# Patient Record
Sex: Female | Born: 1995
Health system: Southern US, Community
[De-identification: ages and names within clinical notes are randomized; demographics above are authoritative.]

## PROBLEM LIST (undated history)

## (undated) DIAGNOSIS — M25561 Pain in right knee: Secondary | ICD-10-CM

## (undated) DIAGNOSIS — G43909 Migraine, unspecified, not intractable, without status migrainosus: Secondary | ICD-10-CM

## (undated) DIAGNOSIS — R51 Headache: Secondary | ICD-10-CM

## (undated) DIAGNOSIS — D169 Benign neoplasm of bone and articular cartilage, unspecified: Secondary | ICD-10-CM

## (undated) DIAGNOSIS — R519 Headache, unspecified: Secondary | ICD-10-CM

## (undated) DIAGNOSIS — N920 Excessive and frequent menstruation with regular cycle: Secondary | ICD-10-CM

## (undated) HISTORY — DX: Pain in right knee: M25.561

## (undated) HISTORY — DX: Excessive and frequent menstruation with regular cycle: N92.0

## (undated) HISTORY — DX: Headache: R51

## (undated) HISTORY — DX: Benign neoplasm of bone and articular cartilage, unspecified: D16.9

## (undated) HISTORY — DX: Headache, unspecified: R51.9

---
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
## 2014-01-11 ENCOUNTER — Encounter: Payer: Self-pay | Admitting: Family

## 2014-01-11 ENCOUNTER — Ambulatory Visit (INDEPENDENT_AMBULATORY_CARE_PROVIDER_SITE_OTHER): Payer: BC Managed Care – PPO | Admitting: Family

## 2014-01-11 VITALS — BP 120/80 | HR 101 | Wt 183.0 lb

## 2014-01-11 DIAGNOSIS — N920 Excessive and frequent menstruation with regular cycle: Secondary | ICD-10-CM

## 2014-01-11 DIAGNOSIS — N921 Excessive and frequent menstruation with irregular cycle: Secondary | ICD-10-CM

## 2014-01-11 DIAGNOSIS — Z3009 Encounter for other general counseling and advice on contraception: Secondary | ICD-10-CM

## 2014-01-11 MED ORDER — LEVONORGEST-ETH ESTRAD 91-DAY 0.15-0.03 &0.01 MG PO TABS: 1.0000 | ORAL_TABLET | Freq: Every day | ORAL | Status: DC

## 2014-01-11 NOTE — Progress Notes (Signed)
   Subjective:    Patient ID: Alyssa Cunningham, female    DOB: 07-23-1996, 18 y.o.   MRN: [id number]  HPI 18 year old white female, nonsmoker, not sexually active female presents today with persistent complaints of abdominal cramping and pain during her menstrual periods. We started Levlen and her last office visit and she's had 2 cycles without much relief.   Review of Systems  Constitutional: Negative.   Respiratory: Negative.   Genitourinary: Positive for menstrual problem. Negative for frequency and hematuria.       Heavy menstrual cycles and cramping.   Musculoskeletal: Negative.   Neurological: Negative.   Psychiatric/Behavioral: Negative.    No past medical history on file.  History   Social History  . Marital Status: Single    Spouse Name: N/A    Number of Children: N/A  . Years of Education: N/A   Occupational History  . Not on file.   Social History Main Topics  . Smoking status: Never Smoker   . Smokeless tobacco: Not on file  . Alcohol Use: No  . Drug Use: No  . Sexual Activity: Not on file   Other Topics Concern  . Not on file   Social History Narrative  . No narrative on file    No past surgical history on file.  No family history on file.  No Known Allergies  No current outpatient prescriptions on file prior to visit.   No current facility-administered medications on file prior to visit.    BP 120/80  Pulse 101  Wt 183 lb (83.008 kg)chart    Objective:   Physical Exam  Constitutional: She is oriented to person, place, and time. She appears well-developed and well-nourished.  Neck: Normal range of motion. Neck supple.  Cardiovascular: Normal rate, regular rhythm and normal heart sounds.   Pulmonary/Chest: Effort normal and breath sounds normal.  Musculoskeletal: Normal range of motion.  Neurological: She is alert and oriented to person, place, and time.  Skin: Skin is warm and dry.  Psychiatric: She has a normal mood and affect.           Assessment & Plan:   Alyssa Cunningham was seen today for no specified reason.  Diagnoses and associated orders for this visit:  Birth control counseling  Menometrorrhagia  Other Orders - Levonorgestrel-Ethinyl Estradiol (SEASONIQUE) 0.15-0.03 &0.01 MG tablet; Take 1 tablet by mouth daily.   Call the office with any questions or concerns. Recheck in 3 months and sooner as needed

## 2014-01-11 NOTE — Patient Instructions (Signed)
Menorrhagia Menorrhagia is when your menstrual periods are heavy or last longer than usual.  HOME CARE  Only take medicine as told by your doctor.  Take any iron pills as told by your doctor. Heavy bleeding may cause low levels of iron in your body.  Do not take aspirin 1 week before or during your period. Aspirin can make the bleeding worse.  Lie down for a while if you change your tampon or pad more than once in 2 hours. This may help lessen the bleeding.  Eat a healthy diet and foods with iron. These foods include leafy green vegetables, meat, liver, eggs, and whole grain breads and cereals.  Do not try to lose weight. Wait until the heavy bleeding has stopped and your iron level is normal. GET HELP IF:  You soak through a pad or tampon every 1 or 2 hours, and this happens every time you have a period.  You need to use pads and tampons at the same time because you are bleeding so much.  You need to change your pad or tampon during the night.  You have a period that lasts for more than 8 days.  You pass clots bigger than 1 inch (2.5 cm) wide.  You have irregular periods that happen more or less often than once a month.  You feel dizzy or pass out (faint).  You feel very weak or tired.  You feel short of breath or feel your heart is beating too fast when you exercise.  You feel sick to your stomach (nausea) and you throw up (vomit) while you are taking your medicine.   You have watery poop (diarrhea) while you are taking your medicine.  You have any problems that may be related to the medicine you are taking.  GET HELP RIGHT AWAY IF:  You soak through 4 or more pads or tampons in 2 hours.  You have any bleeding while you are pregnant. MAKE SURE YOU:   Understand these instructions.  Will watch your condition.  Will get help right away if you are not doing well or get worse. Document Released: 06/22/2008 Document Revised: 05/16/2013 Document Reviewed:  03/15/2013 [facility] Patient Information 2014 [place], [place].

## 2014-04-11 ENCOUNTER — Telehealth: Payer: Self-pay | Admitting: Family

## 2014-04-11 NOTE — Telephone Encounter (Signed)
Pt mother is requesting new rx seasonique bcp sent to [facility]

## 2014-04-15 MED ORDER — LEVONORGEST-ETH ESTRAD 91-DAY 0.15-0.03 &0.01 MG PO TABS
1.0000 | ORAL_TABLET | Freq: Every day | ORAL | Status: DC
Start: 2014-04-15 — End: 2014-04-17

## 2014-04-15 NOTE — Telephone Encounter (Signed)
Pt's mom is following up on pt rx, states pt should have started on yesterday, needs rx today.

## 2014-04-15 NOTE — Telephone Encounter (Signed)
I was not sent original message.   Rx sent to pharmacy

## 2014-04-17 MED ORDER — LEVONORGEST-ETH ESTRAD 91-DAY 0.15-0.03 &0.01 MG PO TABS: 1.0000 | ORAL_TABLET | Freq: Every day | ORAL | Status: DC

## 2014-04-17 NOTE — Addendum Note (Signed)
Addended by: Santiago Bumpers on: 04/17/2014 04:33 PM   Modules accepted: Orders

## 2014-04-17 NOTE — Telephone Encounter (Signed)
Pt's mom states the rx was sent to wrong pharmacy, rx should have went to [facility] and the rx was sent to [facility]. Mom states she has to use [facility]

## 2014-04-17 NOTE — Telephone Encounter (Signed)
done

## 2014-12-05 ENCOUNTER — Ambulatory Visit (INDEPENDENT_AMBULATORY_CARE_PROVIDER_SITE_OTHER)
Admission: RE | Admit: 2014-12-05 | Discharge: 2014-12-05 | Disposition: A | Discharge status: Home / Self Care | Payer: 59 | Source: Ambulatory Visit | Attending: Family | Admitting: Family

## 2014-12-05 ENCOUNTER — Ambulatory Visit: Payer: Self-pay | Admitting: Family

## 2014-12-05 ENCOUNTER — Ambulatory Visit (INDEPENDENT_AMBULATORY_CARE_PROVIDER_SITE_OTHER): Payer: 59 | Admitting: Family

## 2014-12-05 ENCOUNTER — Encounter: Payer: Self-pay | Admitting: Family

## 2014-12-05 VITALS — BP 132/62 | HR 76 | Temp 99.2°F | Resp 18 | Wt 177.6 lb

## 2014-12-05 DIAGNOSIS — M25561 Pain in right knee: Secondary | ICD-10-CM

## 2014-12-05 NOTE — Progress Notes (Signed)
Pre visit review using our clinic review tool, if applicable. No additional management support is needed unless otherwise documented below in the visit note. 

## 2014-12-05 NOTE — Patient Instructions (Signed)

## 2014-12-05 NOTE — Progress Notes (Signed)
   Subjective:    Patient ID: Alyssa Cunningham, female    DOB: Jan 03, 1996, 19 y.o.   MRN: [id number]  HPI 19 year old white female, nonsmoker is in today with complaints of pain to the right knee. Reports injury approximately one year ago that resolved after a fall. However, over the last 2 months she's had pain that she describes as achy in her right knee. Worse with going up heels. Pain is a 2 out of 10 after taking Aleve. Normally 6 out of 10.   Review of Systems  Constitutional: Negative.   Respiratory: Negative.   Cardiovascular: Negative.   Gastrointestinal: Negative.   Endocrine: Negative.   Genitourinary: Negative.   Musculoskeletal: Positive for arthralgias.       Knee pain   Neurological: Negative.   Hematological: Negative.   Psychiatric/Behavioral: Negative.        Objective:   Physical Exam  Constitutional: She appears well-developed and well-nourished.  Neck: Normal range of motion. Neck supple.  Cardiovascular: Normal rate, regular rhythm and normal heart sounds.   Pulmonary/Chest: Effort normal and breath sounds normal.  Musculoskeletal: She exhibits tenderness. She exhibits no edema.  Neurological: She is alert.  Skin: Skin is warm and dry.  Psychiatric: She has a normal mood and affect.          Assessment & Plan:  Alyssa Cunningham was seen today for knee pain.  Diagnoses and all orders for this visit:  Right anterior knee pain Orders: -     DG Knee Complete 4 Views Right; Future   Continue Aleve. Due to previous injury, obtain x-ray of the right knee. Call the office with any questions or concerns. Recheck as scheduled and sooner as needed.

## 2014-12-06 ENCOUNTER — Other Ambulatory Visit: Payer: Self-pay | Admitting: Family

## 2014-12-06 MED ORDER — MELOXICAM 15 MG PO TABS: 15.0000 mg | ORAL_TABLET | Freq: Every day | ORAL | Status: DC

## 2014-12-16 ENCOUNTER — Encounter: Payer: Self-pay | Admitting: Family

## 2014-12-24 ENCOUNTER — Ambulatory Visit (INDEPENDENT_AMBULATORY_CARE_PROVIDER_SITE_OTHER): Payer: 59 | Admitting: Family

## 2014-12-24 ENCOUNTER — Encounter: Payer: Self-pay | Admitting: Family

## 2014-12-24 VITALS — BP 128/70 | HR 96 | Temp 98.7°F | Wt 178.0 lb

## 2014-12-24 DIAGNOSIS — M25561 Pain in right knee: Secondary | ICD-10-CM

## 2014-12-24 DIAGNOSIS — Z309 Encounter for contraceptive management, unspecified: Secondary | ICD-10-CM

## 2014-12-24 DIAGNOSIS — Z3009 Encounter for other general counseling and advice on contraception: Secondary | ICD-10-CM

## 2014-12-24 MED ORDER — NORGESTIM-ETH ESTRAD TRIPHASIC 0.18/0.215/0.25 MG-25 MCG PO TABS: 1.0000 | ORAL_TABLET | Freq: Every day | ORAL | Status: DC

## 2014-12-24 NOTE — Patient Instructions (Signed)
Meniscus Tear with Phase I Rehab The meniscus is a C-shaped cartilage structure, located in the knee joint between the thigh bone (femur) and the shinbone (tibia). Two menisci are located in each knee joint: the inner and outer meniscus. The meniscus acts as an adapter between the thigh bone and shinbone, allowing them to fit properly together. It also functions as a shock absorber, to reduce the stress placed on the knee joint and to help supply nutrients to the knee joint cartilage. As people age, the meniscus begins to harden and become more vulnerable to injury. Meniscus tears are a common injury, especially in older athletes. Inner meniscus tears are more common than outer meniscus tears.  SYMPTOMS   Pain in the knee, especially with standing or squatting with the affected leg.  Tenderness along the joint line.  Swelling in the knee joint (effusion), usually starting 1 to 2 days after injury.  Locking or catching of the knee joint, causing inability to straighten the knee completely.  Giving way or buckling of the knee. CAUSES  A meniscus tear occurs when a force is placed on the meniscus that is greater than it can handle. Common causes of injury include:  Direct hit (trauma) to the knee.  Twisting, pivoting, or cutting (rapidly changing direction while running), kneeling or squatting.  Without injury, due to aging. RISK INCREASES WITH:  Contact sports (football, rugby).  Sports in which cleats are used with pivoting (soccer, lacrosse) or sports in which good shoe grip and sudden change in direction are required (racquetball, basketball, squash).  Previous knee injury.  Associated knee injury, particularly ligament injuries.  Poor strength and flexibility. PREVENTION  Warm up and stretch properly before activity.  Maintain physical fitness:  Strength, flexibility, and endurance.  Cardiovascular fitness.  Protect the knee with a brace or elastic bandage.  Wear  properly fitted protective equipment (proper cleats for the surface). PROGNOSIS  Sometimes, meniscus tears heal on their own. However, definitive treatment requires surgery, followed by at least 6 weeks of recovery.  RELATED COMPLICATIONS   Recurring symptoms that result in a chronic problem.  Repeated knee injury, especially if sports are resumed too soon after injury or surgery.  Progression of the tear (the tear gets larger), if untreated.  Arthritis of the knee in later years (with or without surgery).  Complications of surgery, including infection, bleeding, injury to nerves (numbness, weakness, paralysis) continued pain, giving way, locking, nonhealing of meniscus (if repaired), need for further surgery, and knee stiffness (loss of motion). TREATMENT  Treatment first involves the use of ice and medicine, to reduce pain and inflammation. You may find using crutches to walk more comfortable. However, it is okay to bear weight on the injured knee, if the pain will allow it. Surgery is often advised as a definitive treatment. Surgery is performed through an incision near the joint (arthroscopically). The torn piece of the meniscus is removed, and if possible the joint cartilage is repaired. After surgery, the joint must be restrained. After restraint, it is important to perform strengthening and stretching exercises to help regain strength and a full range of motion. These exercises may be completed at home or with a therapist.  MEDICATION  If pain medicine is needed, nonsteroidal anti-inflammatory medicines (aspirin and ibuprofen), or other minor pain relievers (acetaminophen), are often advised.  Do not take pain medicine for 7 days before surgery.  Prescription pain relievers may be given, if your caregiver thinks they are needed. Use only as directed and   only as much as you need. HEAT AND COLD  Cold treatment (icing) should be applied for 10 to 15 minutes every 2 to 3 hours for  inflammation and pain, and immediately after activity that aggravates your symptoms. Use ice packs or an ice massage.  Heat treatment may be used before performing stretching and strengthening activities prescribed by your caregiver, physical therapist, or athletic trainer. Use a heat pack or a warm water soak. SEEK MEDICAL CARE IF:   Symptoms get worse or do not improve in 2 weeks, despite treatment.  New, unexplained symptoms develop. (Drugs used in treatment may produce side effects.) EXERCISES RANGE OF MOTION (ROM) AND STRETCHING EXERCISES - Meniscus Tear, Non-operative, Phase I These are some of the initial exercises with which you may start your rehabilitation program, until you see your caregiver again or until your symptoms are resolved. Remember:   These initial exercises are intended to be gentle. They will help you restore motion without increasing any swelling.  Completing these exercises allows less painful movement and prepares you for the more aggressive strengthening exercises in Phase II.  An effective stretch should be held for at least 30 seconds.  A stretch should never be painful. You should only feel a gentle lengthening or release in the stretched tissue. RANGE OF MOTION - Knee Flexion, Active  Lie on your back with both knees straight. (If this causes back discomfort, bend your healthy knee, placing your foot flat on the floor.)  Slowly slide your heel back toward your buttocks until you feel a gentle stretch in the front of your knee or thigh.  Hold for __________ seconds. Slowly slide your heel back to the starting position. Repeat __________ times. Complete this exercise __________ times per day.  RANGE OF MOTION - Knee Flexion and Extension, Active-Assisted  Sit on the edge of a table or chair with your thighs firmly supported. It may be helpful to place a folded towel under the end of your right / left thigh.  Flexion (bending): Place the ankle of your  healthy leg on top of the other ankle. Use your healthy leg to gently bend your right / left knee until you feel a mild tension across the top of your knee.  Hold for __________ seconds.  Extension (straightening): Switch your ankles so your right / left leg is on top. Use your healthy leg to straighten your right / left knee until you feel a mild tension on the backside of your knee.  Hold for __________ seconds. Repeat __________ times. Complete __________ times per day. STRETCH - Knee Flexion, Supine  Lie on the floor with your right / left heel and foot lightly touching the wall. (Place both feet on the wall if you do not use a door frame.)  Without using any effort, allow gravity to slide your foot down the wall slowly until you feel a gentle stretch in the front of your right / left knee.  Hold this stretch for __________ seconds. Then return the leg to the starting position, using your healthy leg for help, if needed. Repeat __________ times. Complete this stretch __________ times per day.  STRETCH - Knee Extension Sitting  Sit with your right / left leg/heel propped on another chair, coffee table, or foot stool.  Allow your leg muscles to relax, letting gravity straighten out your knee.*  You should feel a stretch behind your right / left knee. Hold this position for __________ seconds. Repeat __________ times. Complete this stretch __________   times per day.  *Your physician, physical therapist or athletic trainer may instruct you place a __________ weight on your thigh, just above your kneecap, to deepen the stretch.  STRENGTHENING EXERCISES - Meniscus Tear, Non-operative, Phase I These exercises may help you when beginning to rehabilitate your injury. They may resolve your symptoms with or without further involvement from your physician, physical therapist or athletic trainer. While completing these exercises, remember:   Muscles can gain both the endurance and the strength  needed for everyday activities through controlled exercises.  Complete these exercises as instructed by your physician, physical therapist or athletic trainer. Progress the resistance and repetitions only as guided. STRENGTH - Quadriceps, Isometrics  Lie on your back with your right / left leg extended and your opposite knee bent.  Gradually tense the muscles in the front of your right / left thigh. You should see either your knee cap slide up toward your hip or increased dimpling just above the knee. This motion will push the back of the knee down toward the floor, mat, or bed on which you are lying.  Hold the muscle as tight as you can, without increasing your pain, for __________ seconds.  Relax the muscles slowly and completely between each repetition. Repeat __________ times. Complete this exercise __________ times per day.  STRENGTH - Quadriceps, Short Arcs   Lie on your back. Place a __________ inch towel roll under your right / left knee, so that the knee bends slightly.  Raise only your lower leg by tightening the muscles in the front of your thigh. Do not allow your thigh to rise.  Hold this position for __________ seconds. Repeat __________ times. Complete this exercise __________ times per day.  OPTIONAL ANKLE WEIGHTS: Begin with ____________________, but DO NOT exceed ____________________. Increase in 1 pound/0.5 kilogram increments. STRENGTH - Quadriceps, Straight Leg Raises  Quality counts! Watch for signs that the quadriceps muscle is working, to be sure you are strengthening the correct muscles and not "cheating" by substituting with healthier muscles.  Lay on your back with your right / left leg extended and your opposite knee bent.  Tense the muscles in the front of your right / left thigh. You should see either your knee cap slide up or increased dimpling just above the knee. Your thigh may even shake a bit.  Tighten these muscles even more and raise your leg 4 to 6  inches off the floor. Hold for __________ seconds.  Keeping these muscles tense, lower your leg.  Relax the muscles slowly and completely in between each repetition. Repeat __________ times. Complete this exercise __________ times per day.  STRENGTH - Hamstring, Curls   Lay on your stomach with your legs extended. (If you lay on a bed, your feet may hang over the edge.)  Tighten the muscles in the back of your thigh to bend your right / left knee up to 90 degrees. Keep your hips flat on the bed.  Hold this position for __________ seconds.  Slowly lower your leg back to the starting position. Repeat __________ times. Complete this exercise __________ times per day.  STRENGTH - Quadriceps, Squats  Stand in a door frame so that your feet and knees are in line with the frame.  Use your hands for balance, not support, on the frame.  Slowly lower your weight, bending at the hips and knees. Keep your lower legs upright so that they are parallel with the door frame. Squat only within the range that does  not increase your knee pain. Never let your hips drop below your knees.  Slowly return upright, pushing with your legs, not pulling with your hands. Repeat __________ times. Complete this exercise __________ times per day.  STRENGTH - Quad/VMO, Isometric   Sit in a chair with your right / left knee slightly bent. With your fingertips, feel the VMO muscle just above the inside of your knee. The VMO is important in controlling the position of your kneecap.  Keeping your fingertips on this muscle. Without actually moving your leg, attempt to drive your knee down as if straightening your leg. You should feel your VMO tense. If you have a difficult time, you may wish to try the same exercise on your healthy knee first.  Tense this muscle as hard as you can without increasing any knee pain.  Hold for __________ seconds. Relax the muscles slowly and completely in between each repetition. Repeat  __________ times. Complete exercise __________ times per day.  Document Released: 09/27/1998 Document Revised: 12/06/2011 Document Reviewed: 12/26/2008 [facility] Patient Information 2015 [place], [place]. This information is not intended to replace advice given to you by your health care provider. Make sure you discuss any questions you have with your health care provider.

## 2014-12-24 NOTE — Progress Notes (Signed)
   Subjective:    Patient ID: Alyssa Cunningham, female    DOB: 02/06/1996, 19 y.o.   MRN: [id number]  HPI 19 year old white female, nonsmoker is in today for right knee pain that's persist over the last year after a fall. Reports the symptoms being bad for about 6 months resolving for a couple months them returning. The pain daily is a 4 out of 10 while taking meloxicam. When she does not take the medication at 8 out of 10. Worse with walking. Denies any new injury. Had an x-ray done that was normal. Continues to have problems.  Also has concerns that her menstrual cycles are extremely heavy after Seasonique. Will like to have a monthly menstrual cycle and change birth control.  Review of Systems  Constitutional: Negative.   Respiratory: Negative.   Cardiovascular: Negative.   Gastrointestinal: Negative.   Endocrine: Negative.   Musculoskeletal: Positive for arthralgias.       Right knee pain  Skin: Negative.   Allergic/Immunologic: Negative.   Psychiatric/Behavioral: Negative.    History reviewed. No pertinent past medical history.  History   Social History  . Marital Status: Single    Spouse Name: N/A  . Number of Children: N/A  . Years of Education: N/A   Occupational History  . Not on file.   Social History Main Topics  . Smoking status: Never Smoker   . Smokeless tobacco: Not on file  . Alcohol Use: No  . Drug Use: No  . Sexual Activity: Not on file   Other Topics Concern  . Not on file   Social History Narrative    History reviewed. No pertinent past surgical history.  History reviewed. No pertinent family history.  No Known Allergies  Current Outpatient Prescriptions on File Prior to Visit  Medication Sig Dispense Refill  . meloxicam (MOBIC) 15 MG tablet Take 1 tablet (15 mg total) by mouth daily. 30 tablet 0   No current facility-administered medications on file prior to visit.    BP 128/70 mmHg  Pulse 96  Temp(Src) 98.7 F (37.1 C) (Oral)  Wt 178  lb (80.74 kg)  LMP 02/22/2016chart    Objective:   Physical Exam  Constitutional: She is oriented to person, place, and time. She appears well-developed and well-nourished.  Neck: Normal range of motion. Neck supple.  Cardiovascular: Normal rate, regular rhythm and normal heart sounds.   Pulmonary/Chest: Effort normal and breath sounds normal.  Abdominal: Soft. Bowel sounds are normal.  Musculoskeletal: She exhibits edema and tenderness.  Neurological: She is alert and oriented to person, place, and time.  Skin: Skin is warm and dry.  Psychiatric: She has a normal mood and affect.          Assessment & Plan:  Manreet was seen today for knee pain.  Diagnoses and all orders for this visit:  Right knee pain Orders: -     MR Knee Right Wo Contrast; Future  Birth control counseling  Other orders -     Norgestimate-Ethinyl Estradiol Triphasic (ORTHO TRI-CYCLEN LO) 0.18/0.215/0.25 MG-25 MCG tab; Take 1 tablet by mouth daily.   MRI of the right knee will notify patient pending results. Refer to orthopedics. Meloxicam as needed for pain.

## 2014-12-24 NOTE — Progress Notes (Signed)
Pre visit review using our clinic review tool, if applicable. No additional management support is needed unless otherwise documented below in the visit note. 

## 2014-12-27 ENCOUNTER — Other Ambulatory Visit: Payer: 59

## 2015-01-03 ENCOUNTER — Ambulatory Visit
Admission: RE | Admit: 2015-01-03 | Discharge: 2015-01-03 | Disposition: A | Discharge status: Home / Self Care | Payer: 59 | Source: Ambulatory Visit | Attending: Family | Admitting: Family

## 2015-01-03 DIAGNOSIS — M25561 Pain in right knee: Secondary | ICD-10-CM

## 2015-01-05 ENCOUNTER — Other Ambulatory Visit: Payer: Self-pay | Admitting: Family

## 2015-01-05 DIAGNOSIS — D169 Benign neoplasm of bone and articular cartilage, unspecified: Secondary | ICD-10-CM

## 2015-01-06 ENCOUNTER — Encounter: Payer: Self-pay | Admitting: Family

## 2015-01-08 ENCOUNTER — Encounter: Payer: Self-pay | Admitting: Family

## 2015-01-08 ENCOUNTER — Other Ambulatory Visit: Payer: Self-pay

## 2015-01-08 NOTE — Telephone Encounter (Signed)
Error

## 2015-02-20 ENCOUNTER — Encounter: Payer: Self-pay | Admitting: Family Medicine

## 2015-02-20 ENCOUNTER — Ambulatory Visit (INDEPENDENT_AMBULATORY_CARE_PROVIDER_SITE_OTHER): Payer: 59 | Admitting: Family Medicine

## 2015-02-20 VITALS — BP 110/76 | Temp 98.5°F | Ht 68.0 in | Wt 173.2 lb

## 2015-02-20 DIAGNOSIS — R51 Headache: Secondary | ICD-10-CM

## 2015-02-20 DIAGNOSIS — N938 Other specified abnormal uterine and vaginal bleeding: Secondary | ICD-10-CM | POA: Diagnosis not present

## 2015-02-20 DIAGNOSIS — R519 Headache, unspecified: Secondary | ICD-10-CM | POA: Insufficient documentation

## 2015-02-20 DIAGNOSIS — Z7189 Other specified counseling: Secondary | ICD-10-CM

## 2015-02-20 DIAGNOSIS — N946 Dysmenorrhea, unspecified: Secondary | ICD-10-CM

## 2015-02-20 DIAGNOSIS — G8929 Other chronic pain: Secondary | ICD-10-CM

## 2015-02-20 DIAGNOSIS — M25561 Pain in right knee: Secondary | ICD-10-CM | POA: Diagnosis not present

## 2015-02-20 DIAGNOSIS — Z7689 Persons encountering health services in other specified circumstances: Secondary | ICD-10-CM

## 2015-02-20 NOTE — Progress Notes (Signed)
Pre visit review using our clinic review tool, if applicable. No additional management support is needed unless otherwise documented below in the visit note.  HPI:  Alyssa Cunningham is here to establish care. Used to see Padonda whom left this clinic.  Has the following chronic problems that require follow up and concerns today:  Headaches: -since age 19 -occ migraines, frequent mild headaches -sometimes related to periods, recent change in [facility] (used to be on seasonique; recently switched to norgestimate ethinyl estradiol) ,  and had headache with her last period, also seems to happen more when wearing her, no aura contacts - never gets HA when wearing her glasses -most HAs mild and respond to ibuprofen - uses a few times per month -denies: change in HAs, worsening, fevers, malaise, vomiting, vision changes -cbc and TSH normal  Dysmenorrhea/Heavy menstrual bleeding/irr menstrual bleeding: -for several years -mood swings, cramping, changes pad every 2-3 hours for first few days of periods -denies: syncope, dizziness, palpitation, bleeding between periods, hirsutism, breast discharge -denies any hx of sexual activity   R knee/tibial osteochondroma: -since Dec 2016 -seeing ortho, Dr. French Ana at [facility] ortho -takes diclofenac -getting PT -denies: weakness, numbness, worsening  ROS negative for unless reported above: fevers, unintentional weight loss, hearing or vision loss, chest pain, palpitations, struggling to breath, hemoptysis, melena, hematochezia, hematuria, falls, loc, si, thoughts of self harm  Past Medical History  Diagnosis Date  . Frequent headaches     since age 29, migraines rarely  . Menorrhagia     DUB, dysmenorrhea since 2014  . Right knee pain   . Osteochondroma     sees [place] ortho    History reviewed. No pertinent past surgical history.  Family History  Problem Relation Age of Onset  . Adopted: Yes  . Family history unknown: Yes    History   Social History   . Marital Status: Single    Spouse Name: N/A  . Number of Children: N/A  . Years of Education: N/A   Social History Main Topics  . Smoking status: Never Smoker   . Smokeless tobacco: Not on file  . Alcohol Use: No  . Drug Use: No  . Sexual Activity: No   Other Topics Concern  . None   Social History Narrative   Work or School: working at [employer]: lives with mother and brother      Spiritual Beliefs: Christian      Lifestyle: no regular exercise; diet is healthy           Current outpatient prescriptions:  .  diclofenac (VOLTAREN) 75 MG EC tablet, Take 75 mg by mouth 2 (two) times daily., Disp: , Rfl:  .  Norgestimate-Ethinyl Estradiol Triphasic (ORTHO TRI-CYCLEN LO) 0.18/0.215/0.25 MG-25 MCG tab, Take 1 tablet by mouth daily., Disp: 1 Package, Rfl: 11  EXAM:  Filed Vitals:   02/20/15 0945  BP: 110/76  Temp: 98.5 F (36.9 C)    Body mass index is 26.34 kg/(m^2).  GENERAL: vitals reviewed and listed above, alert, oriented, appears well hydrated and in no acute distress  HEENT: atraumatic, conjunttiva clear, no obvious abnormalities on inspection of external nose and ears  NECK: no obvious masses on inspection  LUNGS: clear to auscultation bilaterally, no wheezes, rales or rhonchi, good air movement  CV: HRRR, no peripheral edema  MS: moves all extremities without noticeable abnormality  PSYCH: pleasant and cooperative, no obvious depression or anxiety  ASSESSMENT AND PLAN:  Discussed  the following assessment and plan:  Dysmenorrhea -continue new OCP, she reports thinks is improving -gyn referral if not improving  Frequent headaches -ha journal, mild now and tx with infrequent OTC anagesic  Knee pain, chronic, right -cont with ortho  DUB (dysfunctional uterine bleeding) -see above  Encounter to establish care -We reviewed the PMH, PSH, FH, SH, Meds and Allergies. -We provided refills for any medications we will  prescribe as needed. -We addressed current concerns per orders and patient instructions. -We have asked for records for pertinent exams, studies, vaccines and notes from previous providers. -We have advised patient to follow up per instructions below.   -Patient advised to return or notify a doctor immediately if symptoms worsen or persist or new concerns arise.  There are no Patient Instructions on file for this visit.   Colin Benton R.

## 2015-02-20 NOTE — Patient Instructions (Addendum)
BEFORE YOU LEAVE: -schedule follow up in 3-4 months -see if she wants the Tdap -ensure signed DPR allowing [place] to discuss your health with your mother if you wish  Please see you eye doctor for exam  Keep journal of headaches and triggers

## 2015-03-13 ENCOUNTER — Telehealth: Payer: Self-pay | Admitting: Family Medicine

## 2015-03-13 NOTE — Telephone Encounter (Signed)
Pt called wanting to speak with nurse for [facility] pills rx. Would like to be contacted on the matter. Pharmacy informed her of something about 90day rx and she is unclear.

## 2015-03-14 MED ORDER — NORGESTIM-ETH ESTRAD TRIPHASIC 0.18/0.215/0.25 MG-25 MCG PO TABS: 1.0000 | ORAL_TABLET | Freq: Every day | ORAL | Status: DC

## 2015-03-14 NOTE — Telephone Encounter (Signed)
I spoke with the pts mother and advised her Dr Maudie Mercury approved a 29 day supply and this was sent to her pharmacy.

## 2015-11-04 ENCOUNTER — Other Ambulatory Visit: Payer: Self-pay | Admitting: Family

## 2015-11-17 ENCOUNTER — Ambulatory Visit (INDEPENDENT_AMBULATORY_CARE_PROVIDER_SITE_OTHER): Payer: 59 | Admitting: Sports Medicine

## 2015-11-17 ENCOUNTER — Encounter: Payer: Self-pay | Admitting: Sports Medicine

## 2015-11-17 VITALS — BP 124/73 | HR 77 | Ht 68.5 in | Wt 166.0 lb

## 2015-11-17 DIAGNOSIS — M25561 Pain in right knee: Secondary | ICD-10-CM

## 2015-11-17 MED ORDER — DICLOFENAC SODIUM 75 MG PO TBEC: DELAYED_RELEASE_TABLET | ORAL | Status: DC

## 2015-11-17 NOTE — Progress Notes (Signed)
  Alyssa Cunningham - 20 y.o. female MRN [id number]  Date of birth: 1995/12/02  SUBJECTIVE:  Including CC & ROS.  No chief complaint on file. CC: R knee pain  HPI: Patient presents today with >1 year of R knee pain. First noticed the pain in Dec 2015 while she was seated with legs crossed, then pain became more persistent. Pain described as an ache in her knee that is occasionally sharp and burning. At her baseline, the pain is a 2-3/10 but can escalate to a 7-8/10. When the pain is severe, she takes Advil, uses a heating pad, and elevates her leg. Was prescribed Mobic by her PCP, but this did not improve her pain. She has been unable to play tennis or run longer distances since the pain began. Is a Ship broker at [employer] and has noticed her pain worsens when she walks up hills and steps more frequently. Squatting and lunges also make her pain worse. No prior history of knee trauma or surgery.   HISTORY: Past Medical, Surgical, Social, and Family History Reviewed & Updated per EMR.   Pertinent Historical Findings include: No significant PMH Is a sophomore at [employer]: 4-view X-rays of R knee from 12/05/14 MRI R knee from 01/03/2015  PHYSICAL EXAM:  VS: BP:124/73 mmHg  HR:77bpm  TEMP: ( )  RESP:   HT:5' 8.5" (174 cm)   WT:166 lb (75.297 kg)  BMI:24.9 PHYSICAL EXAM: Gen: Well-appearing Caucasian female, NAD HEENT: normocephalic, atraumatic, EOMI Pulm: non-labored respirations  R knee: No joint effusion or erythema Non-tender to palpation over the quad tendon, patella, patellar tendon, medial or lateral joint lines Full ROM with flexion and extension 5/5 strength with knee flexion and extension.  No increased varus and valgus laxity.  Tight lateral retinaculum Neurovascularly intact  ASSESSMENT & PLAN: See problem based charting & AVS for pt instructions. 1.) Chondromalacia patella - Patient's history of persistent knee pain worsened with knee flexion and physical exam  findings are consistent with chondromalacia patella. MRI and x-ray reassuring. Patient has not completed PT in the past and she would greatly benefit from quad strengthening exercises. - PT script written, patient will find PT available at [facility] - Diclofenac for when pain is severe, d/c other NSAIDs while using - Avoid squatting, lunges, and breast stroke while recovering - Will follow up in approx 8 weeks as class schedule allows

## 2016-01-19 ENCOUNTER — Encounter: Payer: Self-pay | Admitting: Sports Medicine

## 2016-01-19 ENCOUNTER — Ambulatory Visit (INDEPENDENT_AMBULATORY_CARE_PROVIDER_SITE_OTHER): Payer: 59 | Admitting: Sports Medicine

## 2016-01-19 VITALS — BP 126/56 | Ht 68.5 in | Wt 166.0 lb

## 2016-01-19 DIAGNOSIS — M25561 Pain in right knee: Secondary | ICD-10-CM | POA: Diagnosis not present

## 2016-01-19 NOTE — Progress Notes (Signed)
   Subjective:    Patient ID: Alyssa Cunningham, female    DOB: [birth year], 20 y.o.   MRN: [id number]  HPI   Patient comes in today for follow-up on right knee pain. Pain has improved with physical therapy. 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 She is going 2 to 3 times a week. Still using her knee sleeve with activity. She is no longer needing diclofenac. She is here today with her mother.    Review of Systems     Objective:   Physical Exam  Well-developed, well-nourished. No acute distress  Right knee: Full range of motion. No effusion. Negative patellar grind. No joint line tenderness. Good ligamentous stability. There is still some slight quad weakness. Neurovascularly intact distally.       Assessment & Plan:   Improving right knee pain secondary to patellofemoral pain syndrome/chondromalacia patella  Patient is making good progress. Encouraged her to continue with physical therapy until the therapist feels like she is ready for a home exercise program. She has 3 weeks left of school and she will then be returning to [place]. She will let me know if she needs more formal PT upon her arrival back to [place]. Otherwise, she understands the importance of continuing with her exercises. I think she can start to increase activity as tolerated. She can also begin to decrease usage of the compression sleeve if she would like. She will follow-up with me as needed.

## 2016-01-27 ENCOUNTER — Other Ambulatory Visit: Payer: Self-pay | Admitting: Family Medicine

## 2016-02-09 ENCOUNTER — Ambulatory Visit (INDEPENDENT_AMBULATORY_CARE_PROVIDER_SITE_OTHER): Payer: 59 | Admitting: Sports Medicine

## 2016-02-09 ENCOUNTER — Encounter: Payer: Self-pay | Admitting: Sports Medicine

## 2016-02-09 ENCOUNTER — Ambulatory Visit
Admission: RE | Admit: 2016-02-09 | Discharge: 2016-02-09 | Disposition: A | Discharge status: Home / Self Care | Payer: 59 | Source: Ambulatory Visit | Attending: Sports Medicine | Admitting: Sports Medicine

## 2016-02-09 VITALS — BP 111/69 | Ht 68.5 in | Wt 165.0 lb

## 2016-02-09 DIAGNOSIS — M79645 Pain in left finger(s): Secondary | ICD-10-CM

## 2016-02-09 NOTE — Progress Notes (Signed)
   Subjective:    Patient ID: Alyssa Cunningham, female    DOB: 1996-02-19, 20 y.o.   MRN: [id number]  HPI  CC: left thumb pain  Alyssa Cunningham is a 20-y.o. female who presents with left thumb pain after shutting her left thumb in a door 2 weeks ago.  She was seen at an urgent care facility near her school after the incident, where x-rays revealed a possible fracture of the left thumb.  She was placed in a thumb splint and advised to follow up here.  Currently, she complains of pain mainly over the base of her left thumbnail.  She has been wearing the thumb splint for all activities and taking it off only to shower.  Taking Ibuprofen or Tylenol occasionally for pain.  Past medical and surgical history unremarkable. Medications reviewed. Allergies reviewed.  Review of Systems As noted in HPI.    Objective:   Physical Exam  Well-developed, well-nourished.  Alert and oriented x3.  Vital signs reviewed.  Left thumb: No apparent deformity, swelling, or bruising on inspection.  Tenderness to palpation over based of nailbed.  Range of motion limited by pain and stiffness.  Left upper extremity otherwise neurovascularly intact.  X-ray of left thumb obtained on 01/24/16 at urgent care facility.  Images not available.  Report reviewed and revealed a questionable nondisplaced fracture of the proximal volar corner base of the distal phalanx of the thumb.      Assessment & Plan:   Left thumb pain with questionable nondisplaced fracture of the distal phalanx  Given that images of previously obtained x-ray are unavailable to [place], will obtain new images today.  This will help [place] discern whether or not patient truly does have a distal phalanx fracture.  If no fracture is seen, plan to allow patient to remove splint and begin on working on range of motion.  If fracture is present, patient will likely need volar splint placed with thumb in slight flexion.  We will contact the patient with the x-ray results once we  have received them.  Will need to schedule follow-up in office depending on what x-ray shows.  Jenna E. Yolanda Bonine, M.D.  Patient seen and evaluated with the resident. I agree with the above plan of care. Today's x-rays show no evidence of fracture. Patient is reassured of these findings. She may discontinue her stack splint and resume activity as tolerated. Follow-up for ongoing or recalcitrant issues.

## 2016-05-22 ENCOUNTER — Other Ambulatory Visit: Payer: Self-pay | Admitting: Family Medicine

## 2016-07-06 ENCOUNTER — Other Ambulatory Visit: Payer: Self-pay | Admitting: Family Medicine

## 2016-07-15 MED ORDER — NORGESTIM-ETH ESTRAD TRIPHASIC 0.18/0.215/0.25 MG-25 MCG PO TABS: 1.0000 | ORAL_TABLET | Freq: Every day | ORAL | 0 refills | Status: DC

## 2017-03-11 ENCOUNTER — Ambulatory Visit (INDEPENDENT_AMBULATORY_CARE_PROVIDER_SITE_OTHER): Payer: 59 | Admitting: Family Medicine

## 2017-03-11 ENCOUNTER — Encounter: Payer: Self-pay | Admitting: Family Medicine

## 2017-03-11 VITALS — BP 122/82 | HR 67 | Resp 12 | Ht 68.5 in | Wt 183.6 lb

## 2017-03-11 DIAGNOSIS — N938 Other specified abnormal uterine and vaginal bleeding: Secondary | ICD-10-CM

## 2017-03-11 DIAGNOSIS — M545 Low back pain, unspecified: Secondary | ICD-10-CM

## 2017-03-11 DIAGNOSIS — F419 Anxiety disorder, unspecified: Secondary | ICD-10-CM

## 2017-03-11 DIAGNOSIS — N946 Dysmenorrhea, unspecified: Secondary | ICD-10-CM | POA: Diagnosis not present

## 2017-03-11 MED ORDER — METHOCARBAMOL 500 MG PO TABS: 500.0000 mg | ORAL_TABLET | Freq: Three times a day (TID) | ORAL | 0 refills | Status: AC | PRN

## 2017-03-11 MED ORDER — NORETHINDRONE ACET-ETHINYL EST 1.5-30 MG-MCG PO TABS: 1.0000 | ORAL_TABLET | Freq: Every day | ORAL | 2 refills | Status: DC

## 2017-03-11 MED ORDER — KETOROLAC TROMETHAMINE 60 MG/2ML IM SOLN
60.0000 mg | Freq: Once | INTRAMUSCULAR | Status: AC
  Administered 2017-03-11: 60 mg via INTRAMUSCULAR

## 2017-03-11 NOTE — Patient Instructions (Signed)
[name] I have seen you today for an acute visit.  A few things to remember from today's visit:   Dysmenorrhea - Plan: Norethindrone Acetate-Ethinyl Estradiol (JUNEL 1.5/30) 1.5-30 MG-MCG tablet  Dysfunctional uterine bleeding - Plan: Norethindrone Acetate-Ethinyl Estradiol (JUNEL 1.5/30) 1.5-30 MG-MCG tablet  Bilateral low back pain without sciatica, unspecified chronicity - Plan: methocarbamol (ROBAXIN) 500 MG tablet        Back pain is very common in adults.The cause of back pain is rarely dangerous and the pain often gets better over time even with no pharmacologic treatment.  The cause of your back pain may not be known. Some common causes of back pain include: 1. Strain of the muscles or ligaments supporting the spine. 2. Wear and tear (degeneration) of the spinal disks. 3. Arthritis. 4. Direct injury to the back.  For many people, back pain may return. Since back pain is rarely dangerous, most people can learn to manage this condition on their own.  HOME CARE INSTRUCTIONS Watch your back pain for any changes. The following actions may help to lessen any discomfort you are feeling:  1. Remain active. It is stressful on your back to sit or stand in one place for long periods of time. Do not sit, drive, or stand in one place for more than 30 minutes at a time. Take short walks on even surfaces as soon as you are able.Try to increase the length of time you walk each day.  2. Exercise regularly as directed by your health care provider. Exercise helps your back heal faster. It also helps avoid future injury by keeping your muscles strong and flexible.  3. Do not stay in bed.Resting more than 1-2 days can delay your recovery.                                                      4. Pay attention to your body when you bend and lift. The most comfortable positions are those that put less stress on your recovering back.  5.  Always use proper lifting techniques,  including: Bending your knees. Keeping the load close to your body. Avoiding twisting.  6. Find a comfortable position to sleep. Use a firm mattress and lie on your side with your knees slightly bent. If you lie on your back, put a pillow under your knees.  7. Over the counter rubbing medications like Icy Hot or Asper cream with Lidocaine may help without significant side effects.  Acetaminophen and/or Aleve/Ibuprofen can be taken if needed and if not contraindications. Local ice and heat may be alternated to reduce pain and spasms. Also massage and even chiropractor treatment.      Muscle relaxants might or might not help, they cause drowsiness among other    side effects. They could also interact with some of medications you may be already taking (medications for depression/anxiety and some pain medications).   8. Maintain a healthy weight. Excess weight puts extra stress on your back and makes it difficult to maintain good posture.   SEEK MEDICAL CARE IF: worsening pain, associated fever, rash/edema on area, pain going to legs or buttocks, numbness/tingling, night pain, or abnormal weight loss.    SEEK IMMEDIATE MEDICAL CARE IF:  1. You develop new bowel or bladder control problems. 2. You have unusual weakness or numbness in your  arms or legs. 3. You develop nausea or vomiting. 4. You develop abdominal pain. 5. You feel faint.     Back Exercises The following exercises strengthen the muscles that help to support the back. They also help to keep the lower back flexible. Doing these exercises can help to prevent back pain or lessen existing pain. If you have back pain or discomfort, try doing these exercises 2-3 times each day or as told by your health care provider. When the pain goes away, do them once each day, but increase the number of times that you repeat the steps for each exercise (do more repetitions). If you do not have back pain or discomfort, do these exercises once each  day or as told by your health care provider.   EXERCISES Single Knee to Chest Repeat these steps 3-5 times for each leg: 5. Lie on your back on a firm bed or the floor with your legs extended. 6. Bring one knee to your chest. Your other leg should stay extended and in contact with the floor. 7. Hold your knee in place by grabbing your knee or thigh. 8. Pull on your knee until you feel a gentle stretch in your lower back. 9. Hold the stretch for 10-30 seconds. 10. Slowly release and straighten your leg.  Pelvic Tilt Repeat these steps 5-10 times: 2. Lie on your back on a firm bed or the floor with your legs extended. 3. Bend your knees so they are pointing toward the ceiling and your feet are flat on the floor. 4. Tighten your lower abdominal muscles to press your lower back against the floor. This motion will tilt your pelvis so your tailbone points up toward the ceiling instead of pointing to your feet or the floor. 5. With gentle tension and even breathing, hold this position for 5-10 seconds.  Cat-Cow Repeat these steps until your lower back becomes more flexible: 1. Get into a hands-and-knees position on a firm surface. Keep your hands under your shoulders, and keep your knees under your hips. You may place padding under your knees for comfort. 2. Let your head hang down, and point your tailbone toward the floor so your lower back becomes rounded like the back of a cat. 3. Hold this position for 5 seconds. 4. Slowly lift your head and point your tailbone up toward the ceiling so your back forms a sagging arch like the back of a cow. 5. Hold this position for 5 seconds.   Press-Ups Repeat these steps 5-10 times: 6. Lie on your abdomen (face-down) on the floor. 7. Place your palms near your head, about shoulder-width apart. 8. While you keep your back as relaxed as possible and keep your hips on the floor, slowly straighten your arms to raise the top half of your body and lift your  shoulders. Do not use your back muscles to raise your upper torso. You may adjust the placement of your hands to make yourself more comfortable. 9. Hold this position for 5 seconds while you keep your back relaxed. 10. Slowly return to lying flat on the floor.   Bridges Repeat these steps 10 times: 1. Lie on your back on a firm surface. 2. Bend your knees so they are pointing toward the ceiling and your feet are flat on the floor. 3. Tighten your buttocks muscles and lift your buttocks off of the floor until your waist is at almost the same height as your knees. You should feel the muscles working in  your buttocks and the back of your thighs. If you do not feel these muscles, slide your feet 1-2 inches farther away from your buttocks. 4. Hold this position for 3-5 seconds. 5. Slowly lower your hips to the starting position, and allow your buttocks muscles to relax completely. If this exercise is too easy, try doing it with your arms crossed over your chest.      Medications prescribed today are intended for short period of time and will not be refill upon request, a follow up appointment might be necessary to discuss continuation of of treatment if appropriate.     In general please monitor for signs of worsening symptoms and seek immediate medical attention if any concerning.  If symptoms are not resolved in 4 weeks you should schedule a follow up appointment with your doctor, before if needed.  Please be sure you have an appointment already scheduled with your PCP before you leave today for 3-4 months follow up.

## 2017-03-11 NOTE — Progress Notes (Addendum)
HPI:   ACUTE VISIT:  Chief Complaint  Patient presents with  . medication refills  . Back Pain    [name] is a 21 y.o. female, who is here today with her friend complaining of 5 days of worsening lower back pain, bilateral.  She has had intermittent lower back pain for about 6 months.She is following with chiropractor, who she saw last 5 days ago, treatment helped with pain. Now pain is 4/10 but was "incredibly painful" , it is not radiated. She denies associated LE numbness,tingling,saddle anesthesia,urine/bowel incontinence. She denies any injury or unusual recent activity. Pain is also alleviated by position change.  She is taking Acetaminophen as needed.  Back Pain  This is a chronic problem. The current episode started in the past 7 days. The problem occurs intermittently. The problem has been gradually improving since onset. The pain is present in the lumbar spine. The quality of the pain is described as aching. The pain does not radiate. The symptoms are aggravated by sitting, position and bending. Associated symptoms include pelvic pain. Pertinent negatives include no abdominal pain, bladder incontinence, bowel incontinence, dysuria, fever, headaches, leg pain, numbness, perianal numbness, tingling, weakness or weight loss. She has tried chiropractic manipulation and analgesics for the symptoms. The treatment provided moderate relief.   Today she is also requesting OCP's to treat pelvic pain and heavy menses.  Hx of dysmenorrhea and DUB, she was on Tri Lo Sprintec until a month ago, she discontinued after taking it over a year because it was not helping. She did not follow with PCP because school schedule.  Lower abdominal cramps,severe, usually start a day before or same day of menses start. Pain lasts about 4 days. Heavy bleeding, changes pads q 2 hours, and lasts about 7-10 days. She denies vaginal spotting in between cycles.  She denies sexual activity.  LMP  03/03/17.  She has not had a pap smear.  Hx of anxiety, she denies mood changes with OCP's. Denies depressed mood or suicidal thoughts. Here in office she had an episode of panic attack: Nausea,vomiting,diaphoresis,and pale. She states that these are her typical symptoms. She is on Zoloft 100 mg daily.  Review of Systems  Constitutional: Negative for activity change, appetite change, fatigue, fever, unexpected weight change and weight loss.  HENT: Negative for mouth sores, sore throat and trouble swallowing.   Respiratory: Negative for cough, shortness of breath and wheezing.   Cardiovascular: Negative for leg swelling.  Gastrointestinal: Positive for nausea and vomiting (in office). Negative for abdominal pain, blood in stool and bowel incontinence.       Negative for changes in bowel habits or fecal incontinence.  Endocrine: Negative for cold intolerance and heat intolerance.  Genitourinary: Positive for menstrual problem and pelvic pain. Negative for bladder incontinence, decreased urine volume, dysuria, hematuria, vaginal bleeding and vaginal discharge.       Negative for urine incontinence.  Musculoskeletal: Positive for back pain. Negative for arthralgias and neck pain.  Skin: Negative for rash.  Neurological: Negative for tingling, weakness, numbness and headaches.  Hematological: Negative for adenopathy.  Psychiatric/Behavioral: Negative for confusion. The patient is nervous/anxious.       Current Outpatient Prescriptions on File Prior to Visit  Medication Sig Dispense Refill  . naproxen sodium (ANAPROX) 220 MG tablet Take 220 mg by mouth 2 (two) times daily with a meal.     No current facility-administered medications on file prior to visit.      Past Medical History:  Diagnosis Date  . Frequent headaches    since age 26, migraines rarely  . Menorrhagia    DUB, dysmenorrhea since 2014  . Osteochondroma    sees [facility] [facility]  . Right knee pain    No Known  Allergies  Social History   Social History  . Marital status: Single    Spouse name: N/A  . Number of children: N/A  . Years of education: N/A   Social History Main Topics  . Smoking status: Never Smoker  . Smokeless tobacco: Never Used  . Alcohol use No  . Drug use: No  . Sexual activity: No   Other Topics Concern  . None   Social History Narrative   Work or School: working at [employer]: lives with mother and brother      Spiritual Beliefs: Christian      Lifestyle: no regular exercise; diet is healthy          Vitals:   03/11/17 1040  BP: 122/82  Pulse: 67  Resp: 12  O2 sat at RA 97% Body mass index is 27.5 kg/m.   Physical Exam  Nursing note and vitals reviewed. Constitutional: She is oriented to person, place, and time. She appears well-developed. She does not appear ill. No distress.  HENT:  Head: Atraumatic.  Mouth/Throat: Oropharynx is clear and moist and mucous membranes are normal.  Eyes: Conjunctivae and EOM are normal. Pupils are equal, round, and reactive to light.  Cardiovascular: Normal rate and regular rhythm.   No murmur heard. Respiratory: Effort normal and breath sounds normal. No respiratory distress.  GI: Soft. She exhibits no mass. There is no hepatomegaly. There is no tenderness.  Musculoskeletal: She exhibits no edema.  No significant deformity appreciated. Moderate tenderness upon palpation of paraspinal muscles,bilateral from T10 to L5. Pain elicited with movement on exam table during examination. Mild antalgic gait.  No local edema or erythema appreciated, no suspicious lesions.    Neurological: She is alert and oriented to person, place, and time. She has normal strength. No cranial nerve deficit. Coordination and gait normal.  Reflex Scores:      Patellar reflexes are 2+ on the right side and 2+ on the left side. SLR negative bilateral. She can walk on tip toes and heels with no pain or limitations.   Skin: Skin is warm. No rash noted. No erythema.  Psychiatric: Her mood appears anxious.  Appropriately groomed, good eye contact.    ASSESSMENT AND PLAN:   Launa was seen today for medication refills and back pain.  Diagnoses and all orders for this visit:  Bilateral low back pain without sciatica, unspecified chronicity  I do not think imaging is needed at this time. After discussion of side effects and  After verbal consent she agrees with Toradol 60 mg IM. Methocarbamol side effects discussed. Local heat,relative rest,and continue chiropractor treatment. Instructed about warning signs. F/U as needed.  -     methocarbamol (ROBAXIN) 500 MG tablet; Take 1 tablet (500 mg total) by mouth every 8 (eight) hours as needed for muscle spasms. -     ketorolac (TORADOL) injection 60 mg; Inject 2 mLs (60 mg total) into the muscle once.  Dysmenorrhea  We discussed other treatment options. She understands side effects of OCP's,agrees with trying Junel. F/U with PCP in 3-4 months.  -     Norethindrone Acetate-Ethinyl Estradiol (JUNEL 1.5/30) 1.5-30 MG-MCG tablet; Take 1 tablet by mouth daily.  Dysfunctional  uterine bleeding  Junel to start with next menstrual cycle. She can take Ibuprofen 600 mg (OTC) a day before menses and for 2-3 days.  -     Norethindrone Acetate-Ethinyl Estradiol (JUNEL 1.5/30) 1.5-30 MG-MCG tablet; Take 1 tablet by mouth daily.  Acute anxiety  Here in the office she had an episode of panic attack. It happens right after Toradol, so I re-evaluated her and stayed with her until she reported feeling better, after emesis x 1. She ahs had Diclofenac and Naproxen in the past, has not taken any NSAID's today. When she was checking out in the waiting room, she had another episode,so she was brought to a room, LE elevation for a few minutes. She felt better, drank water,and was discharged home once symptoms resolved. She is not driving, clearly instructed about warning  signs.    Return in about 3 months (around 06/11/2017) for dysmenorrhea in 3-4 months with PCP.    -[name] was advised to seek immediate medical attention is symptoms suddenly get worse or to follow if symptoms persist or new concerns arise.       Alixandrea Milleson G. [place], MD  [facility]. [place] office.

## 2017-05-16 ENCOUNTER — Other Ambulatory Visit: Payer: Self-pay

## 2017-05-16 DIAGNOSIS — N946 Dysmenorrhea, unspecified: Secondary | ICD-10-CM

## 2017-05-16 DIAGNOSIS — N938 Other specified abnormal uterine and vaginal bleeding: Secondary | ICD-10-CM

## 2017-05-16 MED ORDER — NORETHINDRONE ACET-ETHINYL EST 1.5-30 MG-MCG PO TABS: 1.0000 | ORAL_TABLET | Freq: Every day | ORAL | 2 refills | Status: DC

## 2017-05-22 DIAGNOSIS — N912 Amenorrhea, unspecified: Secondary | ICD-10-CM | POA: Diagnosis not present

## 2017-05-22 DIAGNOSIS — R319 Hematuria, unspecified: Secondary | ICD-10-CM | POA: Diagnosis not present

## 2017-05-22 DIAGNOSIS — R110 Nausea: Secondary | ICD-10-CM | POA: Diagnosis not present

## 2017-05-22 DIAGNOSIS — R109 Unspecified abdominal pain: Secondary | ICD-10-CM | POA: Diagnosis not present

## 2017-05-22 DIAGNOSIS — R1031 Right lower quadrant pain: Secondary | ICD-10-CM | POA: Diagnosis not present

## 2017-05-24 NOTE — Progress Notes (Signed)
HPI:  Alyssa Cunningham is a pleasant 21 yo with a PMH significant for longstanding dysmenorrhea and DUB here for an acute visit for dub, abd discomfort, nausea, vomiting. In the past advised gyn evaluation if persistent symptoms. Reports she has had some right lower quadrant abdominal pain and nausea for several months. This was rather mild until recently. About a week and a half ago she developed sudden onset of worsening abdominal pain, nausea, vomiting several times and watery diarrhea. She reports she was seen in the emergency room in the evening and had labs, urine studies and a CT of the abdomen and pelvis that were unrevealing.these symptoms have improved some. She started her period and is currently on the third day. She has a long history of irregular periods, some abnormal facial hair, struggles with keeping her weight off. She plans to see a gynecologist, as her mother is worried about polycystic ovarian syndrome. She has never had a Pap smear. She denies being sexually active. She admits to smoking weed occasionally. Denies any other drug use, tobacco use or alcohol use on a regular basis. She does have some acid reflux chronically. With occasional heartburn, belching and reflux. She does not take medication for this. She has a history of mood disorder and was seeing a psychiatrist. She was not happy with this provider will be changing to a psychiatrist in between. She takes Zoloft and is in need of refill. Reports her mood is currently stable denies any significant anxiety, depression, thoughts of harm.  ROS: See pertinent positives and negatives per HPI.  Past Medical History:  Diagnosis Date  . Frequent headaches    since age 25, migraines rarely  . Menorrhagia    DUB, dysmenorrhea since 2014  . Osteochondroma    sees [facility]  . Right knee pain     No past surgical history on file.  Family History  Problem Relation Age of Onset  . Adopted: Yes  . Family history unknown: Yes     Social History   Social History  . Marital status: Single    Spouse name: N/A  . Number of children: N/A  . Years of education: N/A   Social History Main Topics  . Smoking status: Never Smoker  . Smokeless tobacco: Never Used  . Alcohol use No  . Drug use: No  . Sexual activity: No   Other Topics Concern  . None   Social History Narrative   Work or School: working at [employer]: lives with mother and brother      Spiritual Beliefs: Christian      Lifestyle: no regular exercise; diet is healthy           Current Outpatient Prescriptions:  .  naproxen sodium (ANAPROX) 220 MG tablet, Take 220 mg by mouth 2 (two) times daily with a meal., Disp: , Rfl:  .  Norethindrone Acetate-Ethinyl Estradiol (JUNEL 1.5/30) 1.5-30 MG-MCG tablet, Take 1 tablet by mouth daily., Disp: 3 Package, Rfl: 2 .  ondansetron (ZOFRAN-ODT) 4 MG disintegrating tablet, , Disp: , Rfl:  .  sertraline (ZOLOFT) 100 MG tablet, Take 1 tablet (100 mg total) by mouth daily., Disp: 90 tablet, Rfl: 0 .  esomeprazole (NEXIUM) 20 MG capsule, Take 1 capsule (20 mg total) by mouth daily at 12 noon., Disp: 14 capsule, Rfl: 0  EXAM:  Vitals:   05/27/17 0940  BP: 100/60  Pulse: 61  Temp: 98.1 F (36.7 C)  Body mass index is 26.49 kg/m.  GENERAL: vitals reviewed and listed above, alert, oriented, appears well hydrated and in no acute distress  HEENT: atraumatic, conjunttiva clear, no obvious abnormalities on inspection of external nose and ears  NECK: no obvious masses on inspection  LUNGS: clear to auscultation bilaterally, no wheezes, rales or rhonchi, good air movement  CV: HRRR, no peripheral edema  ABD: Bowel sounds positive, soft, no rebound or guarding, no significant tenderness to palpation  MS: moves all extremities without noticeable abnormality  PSYCH: pleasant and cooperative, no obvious depression or anxiety  ASSESSMENT AND PLAN:  Discussed the following  assessment and plan:  Right lower quadrant abdominal pain Nausea and vomiting, intractability of vomiting not specified, unspecified vomiting type -chronic issues that may be related to polycystic ovarian syndrome and acid reflux with likely acute process as well - suspicious for viral gastroenteritis, which seems to be resolving -She plans to see a gynecologist -Also will try a short course of over-the-counter dose of Nexium -We'll plan on follow-up in a month or 2 we'll do some labs then -Advised assistant to obtain records from the outside hospital -Consider GI referral if persistent symptoms  Dysmenorrhea -Plans to see GYN and get her first Pap smear  Mood disorder ([name]) -refilled Zoloft  -Patient advised to return or notify a doctor immediately if symptoms worsen or persist or new concerns arise.  Patient Instructions  BEFORE YOU LEAVE: -obtain CT scan report and labs from OSH -follow up: ensure you have CPE/follow up in about 1-2 months  Call to set up appointment with gynecologist and get pap.  Nexium for next 2 weeks.  No dairy for 2 weeks.  I hope you are feeling better soon! Follow up sooner if worsening, new concerns or you are not improving with treatment.  WE NOW OFFER   Alyssa [facility]'s FAST TRACK!!!  SAME DAY Appointments for ACUTE CARE  Such as: Sprains, Injuries, cuts, abrasions, rashes, muscle pain, joint pain, back pain Colds, flu, sore throats, headache, allergies, cough, fever  Ear pain, sinus and eye infections Abdominal pain, nausea, vomiting, diarrhea, upset stomach Animal/insect bites  3 Easy Ways to Schedule: Walk-In Scheduling Call in scheduling Mychart Sign-up: [email or web address]              Alyssa Cunningham., DO

## 2017-05-27 ENCOUNTER — Encounter: Payer: Self-pay | Admitting: Family Medicine

## 2017-05-27 ENCOUNTER — Ambulatory Visit (INDEPENDENT_AMBULATORY_CARE_PROVIDER_SITE_OTHER): Payer: 59 | Admitting: Family Medicine

## 2017-05-27 VITALS — BP 100/60 | HR 61 | Temp 98.1°F | Ht 68.5 in | Wt 176.8 lb

## 2017-05-27 DIAGNOSIS — R112 Nausea with vomiting, unspecified: Secondary | ICD-10-CM | POA: Diagnosis not present

## 2017-05-27 DIAGNOSIS — R1031 Right lower quadrant pain: Secondary | ICD-10-CM | POA: Diagnosis not present

## 2017-05-27 DIAGNOSIS — F39 Unspecified mood [affective] disorder: Secondary | ICD-10-CM | POA: Diagnosis not present

## 2017-05-27 DIAGNOSIS — N946 Dysmenorrhea, unspecified: Secondary | ICD-10-CM | POA: Diagnosis not present

## 2017-05-27 MED ORDER — SERTRALINE HCL 100 MG PO TABS: 100.0000 mg | ORAL_TABLET | Freq: Every day | ORAL | 0 refills | Status: AC

## 2017-05-27 MED ORDER — ESOMEPRAZOLE MAGNESIUM 20 MG PO CPDR: 20.0000 mg | DELAYED_RELEASE_CAPSULE | Freq: Every day | ORAL | 0 refills | Status: DC

## 2017-05-27 NOTE — Patient Instructions (Signed)
BEFORE YOU LEAVE: -obtain CT scan report and labs from OSH -follow up: ensure you have CPE/follow up in about 1-2 months  Call to set up appointment with gynecologist and get pap.  Nexium for next 2 weeks.  No dairy for 2 weeks.  I hope you are feeling better soon! Follow up sooner if worsening, new concerns or you are not improving with treatment.  WE NOW OFFER   [facility] FAST TRACK!!!  SAME DAY Appointments for ACUTE CARE  Such as: Sprains, Injuries, cuts, abrasions, rashes, muscle pain, joint pain, back pain Colds, flu, sore throats, headache, allergies, cough, fever  Ear pain, sinus and eye infections Abdominal pain, nausea, vomiting, diarrhea, upset stomach Animal/insect bites  3 Easy Ways to Schedule: Walk-In Scheduling Call in scheduling Mychart Sign-up: [email or web address]

## 2017-06-06 ENCOUNTER — Encounter: Payer: Self-pay | Admitting: *Deleted

## 2017-06-10 ENCOUNTER — Ambulatory Visit: Payer: 59 | Admitting: Family Medicine

## 2017-06-13 ENCOUNTER — Ambulatory Visit: Payer: 59 | Admitting: Family Medicine

## 2017-06-13 DIAGNOSIS — Z01419 Encounter for gynecological examination (general) (routine) without abnormal findings: Secondary | ICD-10-CM | POA: Diagnosis not present

## 2017-06-13 DIAGNOSIS — N946 Dysmenorrhea, unspecified: Secondary | ICD-10-CM | POA: Diagnosis not present

## 2017-06-13 DIAGNOSIS — R102 Pelvic and perineal pain: Secondary | ICD-10-CM | POA: Diagnosis not present

## 2017-06-16 ENCOUNTER — Encounter: Payer: Self-pay | Admitting: Family Medicine

## 2017-06-30 DIAGNOSIS — R1032 Left lower quadrant pain: Secondary | ICD-10-CM | POA: Diagnosis not present

## 2017-06-30 DIAGNOSIS — N946 Dysmenorrhea, unspecified: Secondary | ICD-10-CM | POA: Diagnosis not present

## 2017-07-08 ENCOUNTER — Encounter: Payer: Self-pay | Admitting: Family Medicine

## 2017-07-08 ENCOUNTER — Ambulatory Visit (INDEPENDENT_AMBULATORY_CARE_PROVIDER_SITE_OTHER): Payer: 59 | Admitting: Family Medicine

## 2017-07-08 VITALS — BP 118/82 | HR 109 | Temp 98.4°F | Ht 68.5 in | Wt 171.0 lb

## 2017-07-08 DIAGNOSIS — R634 Abnormal weight loss: Secondary | ICD-10-CM

## 2017-07-08 DIAGNOSIS — R109 Unspecified abdominal pain: Secondary | ICD-10-CM | POA: Diagnosis not present

## 2017-07-08 DIAGNOSIS — R112 Nausea with vomiting, unspecified: Secondary | ICD-10-CM

## 2017-07-08 LAB — CBC WITH DIFFERENTIAL/PLATELET
BASOS PCT: 0.4 % (ref 0.0–3.0)
Basophils Absolute: 0 10*3/uL (ref 0.0–0.1)
EOS PCT: 1.3 % (ref 0.0–5.0)
Eosinophils Absolute: 0.1 10*3/uL (ref 0.0–0.7)
HEMATOCRIT: 45.1 % (ref 36.0–46.0)
HEMOGLOBIN: 15.1 g/dL — AB (ref 12.0–15.0)
LYMPHS PCT: 30.7 % (ref 12.0–46.0)
Lymphs Abs: 1.6 10*3/uL (ref 0.7–4.0)
MCHC: 33.5 g/dL (ref 30.0–36.0)
MCV: 89.1 fl (ref 78.0–100.0)
MONO ABS: 0.4 10*3/uL (ref 0.1–1.0)
MONOS PCT: 7.6 % (ref 3.0–12.0)
Neutro Abs: 3.1 10*3/uL (ref 1.4–7.7)
Neutrophils Relative %: 60 % (ref 43.0–77.0)
Platelets: 281 10*3/uL (ref 150.0–400.0)
RBC: 5.06 Mil/uL (ref 3.87–5.11)
RDW: 13.1 % (ref 11.5–15.5)
WBC: 5.2 10*3/uL (ref 4.0–10.5)

## 2017-07-08 LAB — COMPREHENSIVE METABOLIC PANEL
ALBUMIN: 4.6 g/dL (ref 3.5–5.2)
ALK PHOS: 49 U/L (ref 39–117)
ALT: 10 U/L (ref 0–35)
AST: 14 U/L (ref 0–37)
BILIRUBIN TOTAL: 0.7 mg/dL (ref 0.2–1.2)
BUN: 7 mg/dL (ref 6–23)
CALCIUM: 8.9 mg/dL (ref 8.4–10.5)
CO2: 28 meq/L (ref 19–32)
CREATININE: 0.82 mg/dL (ref 0.40–1.20)
Chloride: 103 mEq/L (ref 96–112)
GFR: 92.72 mL/min (ref >60.00)
Glucose, Bld: 109 mg/dL — ABNORMAL HIGH (ref 70–99)
POTASSIUM: 3.9 meq/L (ref 3.5–5.1)
SODIUM: 138 meq/L (ref 135–145)
TOTAL PROTEIN: 6.9 g/dL (ref 6.0–8.3)

## 2017-07-08 MED ORDER — ONDANSETRON 4 MG PO TBDP: 4.0000 mg | ORAL_TABLET | Freq: Three times a day (TID) | ORAL | 0 refills | Status: DC | PRN

## 2017-07-08 NOTE — Progress Notes (Signed)
HPI:  Follow up stomach issues: -for at least 6 months -had eval in ER a few months ago with unrevealing CT scan and labs -saw gyn and is on OCPs for irr menses and dysmenorrhea -for the last several months reports: intermittent abdominal pain mostly on the right lower side, but can be diffuse, seems worse with periods, nausea, vomiting several times per month, several looser bowels per day -Denies fevers, malaise, diarrhea, melena, hematochezia,5 pound weight loss since last visit in August -she has not seen a gastroenterologist -Currently on her period, reports has had several periods in the last 2 months  ROS: See pertinent positives and negatives per HPI.  Past Medical History:  Diagnosis Date  . Frequent headaches    since age 26, migraines rarely  . Menorrhagia    DUB, dysmenorrhea since 2014  . Osteochondroma    sees [place] [facility]  . Right knee pain     No past surgical history on file.  Family History  Problem Relation Age of Onset  . Adopted: Yes  . Family history unknown: Yes    Social History   Social History  . Marital status: Single    Spouse name: N/A  . Number of children: N/A  . Years of education: N/A   Social History Main Topics  . Smoking status: Never Smoker  . Smokeless tobacco: Never Used  . Alcohol use No  . Drug use: No  . Sexual activity: No   Other Topics Concern  . None   Social History Narrative   Work or School: working at [employer]: lives with mother and brother      Spiritual Beliefs: Christian      Lifestyle: no regular exercise; diet is healthy           Current Outpatient Prescriptions:  .  Norethindrone Acetate-Ethinyl Estradiol (JUNEL 1.5/30) 1.5-30 MG-MCG tablet, Take 1 tablet by mouth daily., Disp: 3 Package, Rfl: 2 .  sertraline (ZOLOFT) 100 MG tablet, Take 1 tablet (100 mg total) by mouth daily., Disp: 90 tablet, Rfl: 0 .  ondansetron (ZOFRAN-ODT) 4 MG disintegrating tablet, Take 1 tablet (4  mg total) by mouth every 8 (eight) hours as needed for nausea or vomiting., Disp: 20 tablet, Rfl: 0  EXAM:  Vitals:   07/08/17 1447  BP: 118/82  Pulse: (!) 109  Temp: 98.4 F (36.9 C)    Body mass index is 25.62 kg/m.  GENERAL: vitals reviewed and listed above, alert, oriented, appears well hydrated and in no acute distress  HEENT: atraumatic, conjunttiva clear, no obvious abnormalities on inspection of external nose and ears  NECK: no obvious masses on inspection  LUNGS: clear to auscultation bilaterally, no wheezes, rales or rhonchi, good air movement  CV: HRRR, no peripheral edema  ABD: bowel sounds positive all 4 quadrants, soft, mild tenderness to palpation diffusely, no rebound or guarding  MS: moves all extremities without noticeable abnormality  PSYCH: pleasant and cooperative, no obvious depression or anxiety  ASSESSMENT AND PLAN:  Discussed the following assessment and plan:  Nausea and vomiting, intractability of vomiting not specified, unspecified vomiting type - Plan: CBC with Differential/Platelet, Comprehensive metabolic panel, Helicobacter pylori special antigen, Celiac Disease Comprehensive Panel with Reflexes, Ambulatory referral to Gastroenterology  Weight loss - Plan: Ambulatory referral to Gastroenterology  Abdominal pain, unspecified abdominal location - Plan: Ambulatory referral to Gastroenterology  -we discussed possible serious and likely etiologies, workup and treatment, treatment risks and return precautions -  after this discussion, Nandi opted for:  -referral to GI  -Labs per orders  -Refill of Zofran -Patient advised to return or notify a doctor immediately if symptoms worsen or persist or new concerns arise.  Patient Instructions  BEFORE YOU LEAVE: -follow up:3-4 months -Labs  -We placed a referral for you as discussed to the gastroenterologist regarding your stomach issues, nausea and vomiting. It usually takes about 1-2 weeks to  process and schedule this referral. If you have not heard from [place] regarding this appointment in 2 weeks please contact our office.  We have ordered labs or studies at this visit. It can take up to 1-2 weeks for results and processing. IF results require follow up or explanation, we will call you with instructions. Clinically stable results will be released to your [facility]. If you have not heard from [place] or cannot find your results in [facility] in 2 weeks please contact our office at [phone].  If you are not yet signed up for [facility], please consider signing up.  I sent a refill of your Zofran.  Please follow up sooner if symptoms are worsening or new symptoms arise.        Colin Benton R., DO

## 2017-07-08 NOTE — Patient Instructions (Addendum)
BEFORE YOU LEAVE: -follow up:3-4 months -Labs  -We placed a referral for you as discussed to the gastroenterologist regarding your stomach issues, nausea and vomiting. It usually takes about 1-2 weeks to process and schedule this referral. If you have not heard from [place] regarding this appointment in 2 weeks please contact our office.  We have ordered labs or studies at this visit. It can take up to 1-2 weeks for results and processing. IF results require follow up or explanation, we will call you with instructions. Clinically stable results will be released to your [facility]. If you have not heard from [place] or cannot find your results in [facility] in 2 weeks please contact our office at [phone].  If you are not yet signed up for [facility], please consider signing up.  I sent a refill of your Zofran.  Please follow up sooner if symptoms are worsening or new symptoms arise.

## 2017-07-11 LAB — CELIAC DISEASE COMPREHENSIVE PANEL WITH REFLEXES
(tTG) Ab, IgA: 1 U/mL
Immunoglobulin A: 133 mg/dL (ref 81–463)

## 2017-07-13 LAB — HELICOBACTER PYLORI  SPECIAL ANTIGEN
MICRO NUMBER:: 81153132
SPECIMEN QUALITY: ADEQUATE

## 2017-07-21 ENCOUNTER — Encounter: Payer: Self-pay | Admitting: Family Medicine

## 2017-07-23 DIAGNOSIS — R112 Nausea with vomiting, unspecified: Secondary | ICD-10-CM | POA: Diagnosis not present

## 2017-07-23 DIAGNOSIS — R109 Unspecified abdominal pain: Secondary | ICD-10-CM | POA: Diagnosis not present

## 2017-07-23 DIAGNOSIS — G8929 Other chronic pain: Secondary | ICD-10-CM | POA: Diagnosis not present

## 2017-07-23 DIAGNOSIS — R1084 Generalized abdominal pain: Secondary | ICD-10-CM | POA: Diagnosis not present

## 2017-07-26 ENCOUNTER — Ambulatory Visit: Payer: Self-pay | Admitting: Nurse Practitioner

## 2017-08-04 ENCOUNTER — Encounter: Payer: Self-pay | Admitting: Gastroenterology

## 2017-08-04 ENCOUNTER — Other Ambulatory Visit (INDEPENDENT_AMBULATORY_CARE_PROVIDER_SITE_OTHER): Payer: 59

## 2017-08-04 ENCOUNTER — Ambulatory Visit (INDEPENDENT_AMBULATORY_CARE_PROVIDER_SITE_OTHER): Payer: 59 | Admitting: Gastroenterology

## 2017-08-04 VITALS — BP 98/64 | HR 72 | Ht 68.0 in | Wt 170.0 lb

## 2017-08-04 DIAGNOSIS — K219 Gastro-esophageal reflux disease without esophagitis: Secondary | ICD-10-CM | POA: Diagnosis not present

## 2017-08-04 DIAGNOSIS — R1011 Right upper quadrant pain: Secondary | ICD-10-CM | POA: Diagnosis not present

## 2017-08-04 DIAGNOSIS — R112 Nausea with vomiting, unspecified: Secondary | ICD-10-CM | POA: Diagnosis not present

## 2017-08-04 LAB — SEDIMENTATION RATE: Sed Rate: 14 mm/hr (ref 0–20)

## 2017-08-04 LAB — HIGH SENSITIVITY CRP: CRP HIGH SENSITIVITY: 1.66 mg/L (ref 0.000–5.000)

## 2017-08-04 MED ORDER — PANCRELIPASE (LIP-PROT-AMYL) 24000-76000 UNITS PO CPEP: ORAL_CAPSULE | ORAL | 1 refills | Status: DC

## 2017-08-04 MED ORDER — PANTOPRAZOLE SODIUM 40 MG PO TBEC: 40.0000 mg | DELAYED_RELEASE_TABLET | Freq: Every day | ORAL | 3 refills | Status: DC

## 2017-08-04 NOTE — Progress Notes (Signed)
08/04/2017 Alyssa Cunningham [id number] 1996-02-02   HISTORY OF PRESENT ILLNESS: This is a pleasant 21 year old female who presents her office today with her mother at the request of her PCP, Dr. Maudie Mercury, for evaluation of several abdominal complaints.  She reports nausea, vomiting, right-sided abdominal pain since May or June of this year.  She says that she is almost always nauseous and vomits about once per week.  Her abdominal pain is present almost every day, but some days it is worse than others.  Pain is always in the same spot on the right side.  She describes it as a cramping, but sometimes then it burns.  She admits to a lot of heartburn and reflux and eats Tums all the time.  She says that none of her symptoms seem to be affected by bowel movements or eating.  She says that sometimes her bowel movements alternate between loose stools once or twice a day and some mild constipation.  She denies any black or bloody stools.  She denies any NSAID use.  Does use very occasional Excedrin.  She also reports about a 20 pound weight loss since August.  Has been using Zofran for nausea, but does not feel like it helps much.  She had a CT scan of the abdomen and pelvis with IV contrast performed on May 22, 2017 at which time the study was unremarkable.  Recent labs including CBC, CMP were unremarkable.  Celiac labs and H. pylori studies were negative.  She is a Ship broker at [employer].    Past Medical History:  Diagnosis Date  . Frequent headaches    since age 52, migraines rarely  . Menorrhagia    DUB, dysmenorrhea since 2014  . Osteochondroma    sees [place] [facility]  . Right knee pain    History reviewed. No pertinent surgical history.  reports that  has never smoked. she has never used smokeless tobacco. She reports that she does not drink alcohol or use drugs. She was adopted. Family history is unknown by patient. No Known Allergies    Outpatient Encounter Medications as of 08/04/2017    Medication Sig  . Norethindrone Acetate-Ethinyl Estradiol (JUNEL 1.5/30) 1.5-30 MG-MCG tablet Take 1 tablet by mouth daily.  . ondansetron (ZOFRAN-ODT) 4 MG disintegrating tablet Take 1 tablet (4 mg total) by mouth every 8 (eight) hours as needed for nausea or vomiting.  . sertraline (ZOLOFT) 100 MG tablet Take 1 tablet (100 mg total) by mouth daily.   No facility-administered encounter medications on file as of 08/04/2017.      REVIEW OF SYSTEMS  : All other systems reviewed and negative except where noted in the History of Present Illness.   PHYSICAL EXAM: BP 98/64   Pulse 72   Ht 5\' 8"  (1.727 m)   Wt 170 lb (77.1 kg)   BMI 25.85 kg/m  General: Well developed white female in no acute distress Head: Normocephalic and atraumatic Eyes:  Sclerae anicteric, conjunctiva pink. Ears: Normal auditory acuity Lungs: Clear throughout to auscultation; no increased WOB. Heart: Regular rate and rhythm; no M/R/G. Abdomen: Soft, non-distended.  BS present.  Mild RUQ and right mid-abdominal TTP. Musculoskeletal: Symmetrical with no gross deformities  Skin: No lesions on visible extremities Extremities: No edema  Neurological: Alert oriented x 4, grossly non-focal Psychological:  Alert and cooperative. Normal mood and affect  ASSESSMENT AND PLAN: *21 year old female with complaints of nausea, vomiting, heartburn/reflux, and right-sided abdominal pain for several months.  This may all  be reflux related, but also considering biliary dyskinesia.  She has had a negative CT scan.  I doubt any inflammatory bowel disease, but will check a CRP and sed rate.  Will check a HIDA scan with [facility].  I am going to start her on pantoprazole 40 mg daily and see how she does with that.  If no improvement and HIDA scan proves negative then may need EGD.   CC:  [name], DO

## 2017-08-04 NOTE — Patient Instructions (Addendum)
Please go to the basement level to have your labs drawn.   We have sent the following medications to your pharmacy for you to pick up at your convenience: [facility] [address] 1. Pantoprazole sodium 40 mg  If you are age 22 or younger, your body mass index should be between 19-25. Your Body mass index is 25.85 kg/m. If this is out of the aformentioned range listed, please consider follow up with your Primary Care Provider.   You have been scheduled for a gastric emptying scan at [facility] on Friday 11-16 at 10:00 am Please arrive at least 15 minutes prior to your appointment for registration. Please make certain not to have anything to eat or drink after midnight the night before your test. Hold all stomach medications (ex: Zofran, phenergan, Reglan) 48 hours prior to your test. If you need to reschedule your appointment, please contact radiology scheduling at [phone]. _____________________________________________________________________ A gastric-emptying study measures how long it takes for food to move through your stomach. There are several ways to measure stomach emptying. In the most common test, you eat food that contains a small amount of radioactive material. A scanner that detects the movement of the radioactive material is placed over your abdomen to monitor the rate at which food leaves your stomach. This test normally takes about 4 hours to complete. _____________________________________________________________________

## 2017-08-08 NOTE — Progress Notes (Signed)
Reviewed and agree with initial management plan.  Saranda Legrande T. Tijah Hane, MD FACG 

## 2017-08-12 ENCOUNTER — Ambulatory Visit (HOSPITAL_COMMUNITY): Payer: 59

## 2017-08-15 ENCOUNTER — Ambulatory Visit (HOSPITAL_COMMUNITY)
Admission: RE | Admit: 2017-08-15 | Discharge: 2017-08-15 | Disposition: A | Discharge status: Home / Self Care | Payer: 59 | Source: Ambulatory Visit | Attending: Gastroenterology | Admitting: Gastroenterology

## 2017-08-15 DIAGNOSIS — R1011 Right upper quadrant pain: Secondary | ICD-10-CM | POA: Diagnosis present

## 2017-08-15 DIAGNOSIS — R932 Abnormal findings on diagnostic imaging of liver and biliary tract: Secondary | ICD-10-CM | POA: Diagnosis not present

## 2017-08-15 DIAGNOSIS — R112 Nausea with vomiting, unspecified: Secondary | ICD-10-CM | POA: Diagnosis not present

## 2017-08-15 MED ORDER — TECHNETIUM TC 99M MEBROFENIN IV KIT
5.0000 | PACK | Freq: Once | INTRAVENOUS | Status: AC | PRN
  Administered 2017-08-15: 5 via INTRAVENOUS

## 2017-08-16 ENCOUNTER — Other Ambulatory Visit: Payer: Self-pay

## 2017-08-16 DIAGNOSIS — K828 Other specified diseases of gallbladder: Secondary | ICD-10-CM

## 2017-09-12 ENCOUNTER — Ambulatory Visit: Payer: Self-pay | Admitting: Surgery

## 2017-09-12 DIAGNOSIS — R12 Heartburn: Secondary | ICD-10-CM | POA: Diagnosis not present

## 2017-09-12 DIAGNOSIS — K811 Chronic cholecystitis: Secondary | ICD-10-CM | POA: Diagnosis not present

## 2017-09-20 ENCOUNTER — Other Ambulatory Visit: Payer: Self-pay

## 2017-09-20 ENCOUNTER — Encounter (HOSPITAL_COMMUNITY): Payer: Self-pay

## 2017-09-20 ENCOUNTER — Observation Stay (HOSPITAL_COMMUNITY)
Admission: EM | Admit: 2017-09-20 | Discharge: 2017-09-23 | Disposition: A | Discharge status: Home / Self Care | Payer: 59 | Attending: Surgery | Admitting: Surgery

## 2017-09-20 ENCOUNTER — Emergency Department (HOSPITAL_COMMUNITY): Payer: 59

## 2017-09-20 DIAGNOSIS — Z79899 Other long term (current) drug therapy: Secondary | ICD-10-CM | POA: Insufficient documentation

## 2017-09-20 DIAGNOSIS — K810 Acute cholecystitis: Secondary | ICD-10-CM | POA: Diagnosis not present

## 2017-09-20 DIAGNOSIS — K819 Cholecystitis, unspecified: Principal | ICD-10-CM | POA: Diagnosis present

## 2017-09-20 DIAGNOSIS — K219 Gastro-esophageal reflux disease without esophagitis: Secondary | ICD-10-CM | POA: Diagnosis not present

## 2017-09-20 DIAGNOSIS — K828 Other specified diseases of gallbladder: Secondary | ICD-10-CM | POA: Diagnosis not present

## 2017-09-20 DIAGNOSIS — R1011 Right upper quadrant pain: Secondary | ICD-10-CM | POA: Diagnosis not present

## 2017-09-20 HISTORY — DX: Migraine, unspecified, not intractable, without status migrainosus: G43.909

## 2017-09-20 LAB — COMPREHENSIVE METABOLIC PANEL
ALBUMIN: 4.8 g/dL (ref 3.5–5.0)
ALK PHOS: 64 U/L (ref 38–126)
ALT: 17 U/L (ref 14–54)
AST: 22 U/L (ref 15–41)
Anion gap: 15 (ref 5–15)
BUN: 10 mg/dL (ref 6–20)
CHLORIDE: 106 mmol/L (ref 101–111)
CO2: 17 mmol/L — ABNORMAL LOW (ref 22–32)
CREATININE: 0.79 mg/dL (ref 0.44–1.00)
Calcium: 9.9 mg/dL (ref 8.9–10.3)
GFR calc non Af Amer: >60 mL/min (ref >60)
GLUCOSE: 104 mg/dL — AB (ref 65–99)
Potassium: 3.2 mmol/L — ABNORMAL LOW (ref 3.5–5.1)
SODIUM: 138 mmol/L (ref 135–145)
Total Bilirubin: 1.9 mg/dL — ABNORMAL HIGH (ref 0.3–1.2)
Total Protein: 8 g/dL (ref 6.5–8.1)

## 2017-09-20 LAB — URINALYSIS, ROUTINE W REFLEX MICROSCOPIC
BILIRUBIN URINE: NEGATIVE
GLUCOSE, UA: 50 mg/dL — AB
HGB URINE DIPSTICK: NEGATIVE
Ketones, ur: 80 mg/dL — AB
Nitrite: NEGATIVE
PROTEIN: 100 mg/dL — AB
SPECIFIC GRAVITY, URINE: 1.032 — AB (ref 1.005–1.030)
pH: 5 (ref 5.0–8.0)

## 2017-09-20 LAB — CBC
HCT: 44.4 % (ref 36.0–46.0)
Hemoglobin: 15.5 g/dL — ABNORMAL HIGH (ref 12.0–15.0)
MCH: 30.2 pg (ref 26.0–34.0)
MCHC: 34.9 g/dL (ref 30.0–36.0)
MCV: 86.5 fL (ref 78.0–100.0)
PLATELETS: 329 10*3/uL (ref 150–400)
RBC: 5.13 MIL/uL — AB (ref 3.87–5.11)
RDW: 12.7 % (ref 11.5–15.5)
WBC: 15.4 10*3/uL — ABNORMAL HIGH (ref 4.0–10.5)

## 2017-09-20 LAB — LIPASE, BLOOD: LIPASE: 22 U/L (ref 11–51)

## 2017-09-20 LAB — I-STAT BETA HCG BLOOD, ED (MC, WL, AP ONLY): I-stat hCG, quantitative: <5 m[IU]/mL (ref <5)

## 2017-09-20 MED ORDER — ENOXAPARIN SODIUM 40 MG/0.4ML ~~LOC~~ SOLN
40.0000 mg | SUBCUTANEOUS | Status: DC
  Administered 2017-09-20 – 2017-09-21 (×2): 40 mg via SUBCUTANEOUS
  Filled 2017-09-20 (×2): qty 0.4

## 2017-09-20 MED ORDER — ONDANSETRON HCL 4 MG/2ML IJ SOLN
4.0000 mg | Freq: Once | INTRAMUSCULAR | Status: AC
  Administered 2017-09-20: 4 mg via INTRAVENOUS
  Filled 2017-09-20: qty 2

## 2017-09-20 MED ORDER — OXYCODONE HCL 5 MG PO TABS
5.0000 mg | ORAL_TABLET | ORAL | Status: DC | PRN
  Administered 2017-09-20 – 2017-09-23 (×6): 10 mg via ORAL
  Filled 2017-09-20 (×6): qty 2

## 2017-09-20 MED ORDER — DIPHENHYDRAMINE HCL 25 MG PO CAPS
25.0000 mg | ORAL_CAPSULE | Freq: Four times a day (QID) | ORAL | Status: DC | PRN
  Filled 2017-09-20: qty 1

## 2017-09-20 MED ORDER — ONDANSETRON 4 MG PO TBDP: 4.0000 mg | ORAL_TABLET | Freq: Four times a day (QID) | ORAL | Status: DC | PRN

## 2017-09-20 MED ORDER — DEXTROSE 5 % IV SOLN
2.0000 g | INTRAVENOUS | Status: DC
  Administered 2017-09-21 – 2017-09-22 (×2): 2 g via INTRAVENOUS
  Filled 2017-09-20 (×3): qty 2

## 2017-09-20 MED ORDER — SODIUM CHLORIDE 0.9 % IV BOLUS (SEPSIS)
1000.0000 mL | Freq: Once | INTRAVENOUS | Status: AC
  Administered 2017-09-20: 1000 mL via INTRAVENOUS

## 2017-09-20 MED ORDER — LACTATED RINGERS IV BOLUS (SEPSIS): 1000.0000 mL | Freq: Once | INTRAVENOUS | Status: DC

## 2017-09-20 MED ORDER — SODIUM CHLORIDE 0.9 % IV BOLUS (SEPSIS): 1000.0000 mL | Freq: Once | INTRAVENOUS | Status: DC

## 2017-09-20 MED ORDER — HYDROMORPHONE HCL 1 MG/ML IJ SOLN
1.0000 mg | INTRAMUSCULAR | Status: DC | PRN
  Administered 2017-09-21 (×2): 1 mg via INTRAVENOUS
  Filled 2017-09-20 (×2): qty 1

## 2017-09-20 MED ORDER — POTASSIUM CHLORIDE IN NACL 20-0.9 MEQ/L-% IV SOLN
INTRAVENOUS | Status: DC
  Administered 2017-09-20 – 2017-09-22 (×4): via INTRAVENOUS
  Filled 2017-09-20 (×8): qty 1000

## 2017-09-20 MED ORDER — ONDANSETRON 4 MG PO TBDP
4.0000 mg | ORAL_TABLET | Freq: Once | ORAL | Status: AC | PRN
  Administered 2017-09-20: 4 mg via ORAL
  Filled 2017-09-20: qty 1

## 2017-09-20 MED ORDER — ONDANSETRON HCL 4 MG/2ML IJ SOLN
4.0000 mg | Freq: Four times a day (QID) | INTRAMUSCULAR | Status: DC | PRN
  Administered 2017-09-21 – 2017-09-22 (×5): 4 mg via INTRAVENOUS
  Filled 2017-09-20 (×5): qty 2

## 2017-09-20 MED ORDER — POTASSIUM CHLORIDE 10 MEQ/100ML IV SOLN
10.0000 meq | INTRAVENOUS | Status: DC
  Administered 2017-09-20: 10 meq via INTRAVENOUS
  Filled 2017-09-20: qty 100

## 2017-09-20 MED ORDER — DEXTROSE 5 % IV SOLN
2.0000 g | Freq: Once | INTRAVENOUS | Status: AC
  Administered 2017-09-20: 2 g via INTRAVENOUS
  Filled 2017-09-20: qty 2

## 2017-09-20 MED ORDER — DIPHENHYDRAMINE HCL 50 MG/ML IJ SOLN: 25.0000 mg | Freq: Four times a day (QID) | INTRAMUSCULAR | Status: DC | PRN

## 2017-09-20 NOTE — ED Provider Notes (Signed)
[facility] EMERGENCY DEPARTMENT Provider Note   CSN: [id number] Arrival date & time: 09/20/17  1237     History   Chief Complaint Chief Complaint  Patient presents with  . Abdominal Pain  . Emesis    HPI Alyssa Cunningham is a 21 y.o. female.  HPI   21 year old female with past medical history as below who presents with abdominal pain and emesis.  The patient has recently undergone extensive evaluation for chronic abdominal pain.  She recently had an abnormal HIDA scan and is scheduled for surgery with Dr. gross in 1 week.  She states that over the last 2 days, she is a progressive worsening severe right upper quadrant pain.  She has had associated nausea and vomiting.  She been unable to eat or drink.  The pain is aching, cramping, and gnawing.  Denies any alleviating factors.  She has not had any fevers but has had some chills.  She tried her over-the-counter antiemetics and was unable to keep anything down.  No urinary symptoms.  Past Medical History:  Diagnosis Date  . Frequent headaches    since age 78, migraines rarely  . Menorrhagia    DUB, dysmenorrhea since 2014  . Osteochondroma    sees [place] [facility]  . Right knee pain     Patient Active Problem List   Diagnosis Date Noted  . Right upper quadrant pain 08/04/2017  . Non-intractable vomiting with nausea 08/04/2017  . Gastroesophageal reflux disease without esophagitis 08/04/2017  . Dysfunctional uterine bleeding 03/11/2017  . Dysmenorrhea 02/20/2015  . Frequent headaches 02/20/2015  . Osteochondroma 01/05/2015  . Menometrorrhagia 10/08/2013    History reviewed. No pertinent surgical history.  OB History    No data available       Home Medications    Prior to Admission medications   Medication Sig Start Date End Date Taking? Authorizing Provider  Norethindrone Acetate-Ethinyl Estradiol (JUNEL 1.5/30) 1.5-30 MG-MCG tablet Take 1 tablet by mouth daily. 05/16/17  Yes Martinique, Betty G, MD    pantoprazole (PROTONIX) 40 MG tablet Take 1 tablet (40 mg total) daily by mouth. 08/04/17  Yes Zehr, Laban Emperor, PA-C  sertraline (ZOLOFT) 100 MG tablet Take 1 tablet (100 mg total) by mouth daily. 05/27/17  Yes Colin Benton R, DO  ondansetron (ZOFRAN-ODT) 4 MG disintegrating tablet Take 1 tablet (4 mg total) by mouth every 8 (eight) hours as needed for nausea or vomiting. Patient not taking: Reported on 09/20/2017 07/08/17   [name], DO    Family History Family History  Adopted: Yes  Family history unknown: Yes    Social History Social History   Tobacco Use  . Smoking status: Never Smoker  . Smokeless tobacco: Never Used  Substance Use Topics  . Alcohol use: No  . Drug use: No     Allergies   Patient has no known allergies.   Review of Systems Review of Systems  Constitutional: Positive for fatigue.  Gastrointestinal: Positive for abdominal pain, nausea and vomiting.  Neurological: Positive for weakness.  All other systems reviewed and are negative.    Physical Exam Updated Vital Signs BP 133/82 (BP Location: Right Arm)   Pulse 92   Temp 98.3 F (36.8 C) (Oral)   Resp 18   Ht 5\' 9"  (1.753 m)   Wt 74.8 kg (165 lb)   LMP 08/27/2017   SpO2 97%   BMI 24.37 kg/m   Physical Exam  Constitutional: She is oriented to person, place, and time.  She appears well-developed and well-nourished. No distress.  HENT:  Head: Normocephalic and atraumatic.  Eyes: Conjunctivae are normal.  Neck: Neck supple.  Cardiovascular: Normal rate, regular rhythm and normal heart sounds. Exam reveals no friction rub.  No murmur heard. Pulmonary/Chest: Effort normal and breath sounds normal. No respiratory distress. She has no wheezes. She has no rales.  Abdominal: She exhibits no distension. There is generalized tenderness. There is guarding and positive Murphy's sign.  Musculoskeletal: She exhibits no edema.  Neurological: She is alert and oriented to person, place, and time. She  exhibits normal muscle tone.  Skin: Skin is warm. Capillary refill takes less than 2 seconds.  Psychiatric: She has a normal mood and affect.  Nursing note and vitals reviewed.    ED Treatments / Results  Labs (all labs ordered are listed, but only abnormal results are displayed) Labs Reviewed  COMPREHENSIVE METABOLIC PANEL - Abnormal; Notable for the following components:      Result Value   Potassium 3.2 (*)    CO2 17 (*)    Glucose, Bld 104 (*)    Total Bilirubin 1.9 (*)    All other components within normal limits  CBC - Abnormal; Notable for the following components:   WBC 15.4 (*)    RBC 5.13 (*)    Hemoglobin 15.5 (*)    All other components within normal limits  URINALYSIS, ROUTINE W REFLEX MICROSCOPIC - Abnormal; Notable for the following components:   Color, Urine AMBER (*)    APPearance HAZY (*)    Specific Gravity, Urine 1.032 (*)    Glucose, UA 50 (*)    Ketones, ur 80 (*)    Protein, ur 100 (*)    Leukocytes, UA TRACE (*)    All other components within normal limits  LIPASE, BLOOD  I-STAT BETA HCG BLOOD, ED ([facility], [facility], [facility] ONLY)    EKG  EKG Interpretation None       Radiology US Abdomen Limited  Result Date: 09/20/2017 CLINICAL DATA:  Right upper quadrant pain for the past 7 months EXAM: ULTRASOUND ABDOMEN LIMITED RIGHT UPPER QUADRANT COMPARISON:  None. FINDINGS: Gallbladder: No gallstones or wall thickening visualized. No sonographic Murphy sign noted by sonographer. Common bile duct: Diameter: 3 mm Liver: No focal lesion identified. Within normal limits in parenchymal echogenicity. Portal vein is patent on color Doppler imaging with normal direction of blood flow towards the liver. IMPRESSION: Normal right upper quadrant ultrasound. Electronically Signed   By: Lajean Manes M.D.   On: 09/20/2017 14:07    Procedures Procedures (including critical care time)  Medications Ordered in ED Medications  cefTRIAXone (ROCEPHIN) 2 g in dextrose 5 % 50 mL IVPB  (not administered)  potassium chloride 10 mEq in 100 mL IVPB (not administered)  lactated ringers bolus 1,000 mL (not administered)  ondansetron (ZOFRAN-ODT) disintegrating tablet 4 mg (4 mg Oral Given 09/20/17 1248)     Initial Impression / Assessment and Plan / ED Course  I have reviewed the triage vital signs and the nursing notes.  Pertinent labs & imaging results that were available during my care of the patient were reviewed by me and considered in my medical decision making (see chart for details).     21 year old female with past medical history as above here with ongoing, recurrent right upper quadrant pain.  She had an abnormal HIDA scan last month and is scheduled for surgery with Dr. gross in several weeks.  I am concerned that she has ongoing biliary dysfunction  and subsequent possible early cholecystitis.  She has a mild leukocytosis and is dehydrated clinically.  Will give her a dose of Rocephin, IV fluids, and plan for admission to surgery.  Discussed with Dr. Ninfa Linden, who is in the OR, but will see the patient and admit.  Final Clinical Impressions(s) / ED Diagnoses   Final diagnoses:  Acute cholecystitis    ED Discharge Orders    None       Duffy Bruce, MD 09/20/17 1724

## 2017-09-20 NOTE — ED Notes (Signed)
Attempted urine collection. Pt aware of need for specimen

## 2017-09-20 NOTE — ED Triage Notes (Signed)
PT reports she is scheduled to have cholecystectomy in 10 days but has experienced increased pain, nausea, and vomiting x 2 days.

## 2017-09-20 NOTE — ED Notes (Signed)
ED Provider at bedside. 

## 2017-09-20 NOTE — H&P (Signed)
Alyssa Cunningham is an 21 y.o. female.   Chief Complaint: Right upper quadrant abdominal pain HPI: This is a 21 year old female who presents with right upper quadrant abdominal pain, nausea, and vomiting.  She is actually been having symptoms for approximately 7 months.  These were mostly pain and nausea.  She had a HIDA scan showing a 22% gallbladder ejection fraction.  Ultrasound was negative for stones.  She saw Dr. Johney Maine in our office and a laparoscopic cholecystectomy has been scheduled.  Over the last 2 days, however, she has developed worsening abdominal pain with nausea and vomiting.  The pain is described as moderate to severe with cramping.  It does not refer anywhere else. She was found to have elevated white blood count.  Ultrasound of the gallbladder was fairly unremarkable   Past Medical History:  Diagnosis Date  . Frequent headaches    since age 24, migraines rarely  . Menorrhagia    DUB, dysmenorrhea since 2014  . Osteochondroma    sees [place] [facility]  . Right knee pain     History reviewed. No pertinent surgical history.  Family History  Adopted: Yes  Family history unknown: Yes   Social History:  reports that  has never smoked. she has never used smokeless tobacco. She reports that she does not drink alcohol or use drugs.  Allergies: No Known Allergies   (Not in a hospital admission)  Results for orders placed or performed during the hospital encounter of 09/20/17 (from the past 48 hour(s))  Lipase, blood     Status: None   Collection Time: 09/20/17 12:51 PM  Result Value Ref Range   Lipase 22 11 - 51 U/L  Comprehensive metabolic panel     Status: Abnormal   Collection Time: 09/20/17 12:51 PM  Result Value Ref Range   Sodium 138 135 - 145 mmol/L   Potassium 3.2 (L) 3.5 - 5.1 mmol/L   Chloride 106 101 - 111 mmol/L   CO2 17 (L) 22 - 32 mmol/L   Glucose, Bld 104 (H) 65 - 99 mg/dL   BUN 10 6 - 20 mg/dL   Creatinine, Ser 0.79 0.44 - 1.00 mg/dL   Calcium 9.9 8.9 - 10.3  mg/dL   Total Protein 8.0 6.5 - 8.1 g/dL   Albumin 4.8 3.5 - 5.0 g/dL   AST 22 15 - 41 U/L   ALT 17 14 - 54 U/L   Alkaline Phosphatase 64 38 - 126 U/L   Total Bilirubin 1.9 (H) 0.3 - 1.2 mg/dL   GFR calc non Af Amer >60 >60 mL/min   GFR calc Af Amer >60 >60 mL/min    Comment: (NOTE) The eGFR has been calculated using the CKD EPI equation. This calculation has not been validated in all clinical situations. eGFR's persistently <60 mL/min signify possible Chronic Kidney Disease.    Anion gap 15 5 - 15  CBC     Status: Abnormal   Collection Time: 09/20/17 12:51 PM  Result Value Ref Range   WBC 15.4 (H) 4.0 - 10.5 K/uL   RBC 5.13 (H) 3.87 - 5.11 MIL/uL   Hemoglobin 15.5 (H) 12.0 - 15.0 g/dL   HCT 44.4 36.0 - 46.0 %   MCV 86.5 78.0 - 100.0 fL   MCH 30.2 26.0 - 34.0 pg   MCHC 34.9 30.0 - 36.0 g/dL   RDW 12.7 11.5 - 15.5 %   Platelets 329 150 - 400 K/uL  I-Stat beta hCG blood, ED     Status:  None   Collection Time: 09/20/17  1:11 PM  Result Value Ref Range   I-stat hCG, quantitative <5.0 <5 mIU/mL   Comment 3            Comment:   GEST. AGE      CONC.  (mIU/mL)   <=1 WEEK        5 - 50     2 WEEKS       50 - 500     3 WEEKS       100 - 10,000     4 WEEKS     1,000 - 30,000        FEMALE AND NON-PREGNANT FEMALE:     LESS THAN 5 mIU/mL   Urinalysis, Routine w reflex microscopic     Status: Abnormal   Collection Time: 09/20/17  4:04 PM  Result Value Ref Range   Color, Urine AMBER (A) YELLOW    Comment: BIOCHEMICALS MAY BE AFFECTED BY COLOR   APPearance HAZY (A) CLEAR   Specific Gravity, Urine 1.032 (H) 1.005 - 1.030   pH 5.0 5.0 - 8.0   Glucose, UA 50 (A) NEGATIVE mg/dL   Hgb urine dipstick NEGATIVE NEGATIVE   Bilirubin Urine NEGATIVE NEGATIVE   Ketones, ur 80 (A) NEGATIVE mg/dL   Protein, ur 100 (A) NEGATIVE mg/dL   Nitrite NEGATIVE NEGATIVE   Leukocytes, UA TRACE (A) NEGATIVE   US Abdomen Limited  Result Date: 09/20/2017 CLINICAL DATA:  Right upper quadrant pain for  the past 7 months EXAM: ULTRASOUND ABDOMEN LIMITED RIGHT UPPER QUADRANT COMPARISON:  None. FINDINGS: Gallbladder: No gallstones or wall thickening visualized. No sonographic Murphy sign noted by sonographer. Common bile duct: Diameter: 3 mm Liver: No focal lesion identified. Within normal limits in parenchymal echogenicity. Portal vein is patent on color Doppler imaging with normal direction of blood flow towards the liver. IMPRESSION: Normal right upper quadrant ultrasound. Electronically Signed   By: Lajean Manes M.D.   On: 09/20/2017 14:07    Review of Systems  All other systems reviewed and are negative.   Blood pressure 128/78, pulse 65, temperature 100 F (37.8 C), temperature source Oral, resp. rate 14, height _0  (1.753 m), weight 74.8 kg (165 lb), last menstrual period 08/27/2017, SpO2 100 %. Physical Exam  Constitutional: She is oriented to person, place, and time. She appears well-developed and well-nourished. She appears distressed.  HENT:  Head: Normocephalic and atraumatic.  Right Ear: External ear normal.  Left Ear: External ear normal.  Nose: Nose normal.  Eyes: Pupils are equal, round, and reactive to light. No scleral icterus.  Neck: Neck supple. No tracheal deviation present.  Cardiovascular: Normal rate, regular rhythm and normal heart sounds.  Respiratory: Effort normal and breath sounds normal. No respiratory distress.  GI: Soft. There is tenderness. There is guarding.  There is tenderness with guarding in the right upper quadrant  Musculoskeletal: Normal range of motion. She exhibits no edema.  Neurological: She is alert and oriented to person, place, and time.  Skin: Skin is warm and dry. No erythema. No pallor.  Psychiatric: Her behavior is normal. Judgment normal.     Assessment/Plan Biliary dyskinesia with worsening chronic cholecystitis  Given her elevated white blood count and tenderness, she may have some element of acute cholecystitis although the  ultrasound was unremarkable.  She is clearly dehydrated and her symptoms have worsened.  Plan will be to admit the patient to the hospital for IV rehydration and probable laparoscopic cholecystectomy in  the next 24-48 hours.  Harl Bowie, MD 09/20/2017, 7:49 PM

## 2017-09-21 ENCOUNTER — Encounter (HOSPITAL_COMMUNITY): Payer: Self-pay | Admitting: General Practice

## 2017-09-21 LAB — COMPREHENSIVE METABOLIC PANEL
ALBUMIN: 3.1 g/dL — AB (ref 3.5–5.0)
ALK PHOS: 39 U/L (ref 38–126)
ALT: 11 U/L — AB (ref 14–54)
AST: 15 U/L (ref 15–41)
Anion gap: 4 — ABNORMAL LOW (ref 5–15)
BILIRUBIN TOTAL: 0.9 mg/dL (ref 0.3–1.2)
BUN: 8 mg/dL (ref 6–20)
CO2: 23 mmol/L (ref 22–32)
CREATININE: 0.82 mg/dL (ref 0.44–1.00)
Calcium: 8 mg/dL — ABNORMAL LOW (ref 8.9–10.3)
Chloride: 112 mmol/L — ABNORMAL HIGH (ref 101–111)
GFR calc Af Amer: >60 mL/min (ref >60)
GFR calc non Af Amer: >60 mL/min (ref >60)
GLUCOSE: 89 mg/dL (ref 65–99)
POTASSIUM: 3.9 mmol/L (ref 3.5–5.1)
Sodium: 139 mmol/L (ref 135–145)
TOTAL PROTEIN: 5.1 g/dL — AB (ref 6.5–8.1)

## 2017-09-21 LAB — CBC
HCT: 35.3 % — ABNORMAL LOW (ref 36.0–46.0)
Hemoglobin: 11.7 g/dL — ABNORMAL LOW (ref 12.0–15.0)
MCH: 29.8 pg (ref 26.0–34.0)
MCHC: 33.1 g/dL (ref 30.0–36.0)
MCV: 90.1 fL (ref 78.0–100.0)
PLATELETS: 215 10*3/uL (ref 150–400)
RBC: 3.92 MIL/uL (ref 3.87–5.11)
RDW: 13.6 % (ref 11.5–15.5)
WBC: 6.7 10*3/uL (ref 4.0–10.5)

## 2017-09-21 LAB — SURGICAL PCR SCREEN
MRSA, PCR: NEGATIVE
Staphylococcus aureus: NEGATIVE

## 2017-09-21 LAB — HIV ANTIBODY (ROUTINE TESTING W REFLEX): HIV Screen 4th Generation wRfx: NONREACTIVE

## 2017-09-21 MED ORDER — PANTOPRAZOLE SODIUM 40 MG PO TBEC
40.0000 mg | DELAYED_RELEASE_TABLET | Freq: Every day | ORAL | Status: DC
  Administered 2017-09-21 – 2017-09-23 (×3): 40 mg via ORAL
  Filled 2017-09-21 (×3): qty 1

## 2017-09-21 MED ORDER — BOOST / RESOURCE BREEZE PO LIQD CUSTOM
1.0000 | Freq: Three times a day (TID) | ORAL | Status: DC
  Administered 2017-09-21 – 2017-09-22 (×3): 1 via ORAL

## 2017-09-21 MED ORDER — SERTRALINE HCL 100 MG PO TABS
100.0000 mg | ORAL_TABLET | Freq: Every day | ORAL | Status: DC
  Administered 2017-09-21 – 2017-09-23 (×3): 100 mg via ORAL
  Filled 2017-09-21 (×3): qty 1

## 2017-09-21 NOTE — Progress Notes (Signed)
Subjective No acute events. Feeling better today than yesterday. Pain improved. Denies n/v  Objective: Vital signs in last 24 hours: Temp:  [98.3 F (36.8 C)-100 F (37.8 C)] 98.4 F (36.9 C) (12/26 0501) Pulse Rate:  [61-92] 62 (12/26 0501) Resp:  [14-18] 18 (12/26 0501) BP: (119-153)/(58-92) 119/58 (12/26 0501) SpO2:  [97 %-100 %] 100 % (12/26 0501) Weight:  [74.8 kg (165 lb)] 74.8 kg (165 lb) (12/25 1242) Last BM Date: 09/20/17  Intake/Output from previous day: No intake/output data recorded. Intake/Output this shift: No intake/output data recorded.  Gen: NAD, comfortable CV: RRR Pulm: Normal work of breathing Abd: Soft, nontender/nondistended; no r/g Ext: SCDs in place  Lab Results: CBC  Recent Labs    09/20/17 1251 09/21/17 0828  WBC 15.4* 6.7  HGB 15.5* 11.7*  HCT 44.4 35.3*  PLT 329 215   BMET Recent Labs    09/20/17 1251 09/21/17 0347  NA 138 139  K 3.2* 3.9  CL 106 112*  CO2 17* 23  GLUCOSE 104* 89  BUN 10 8  CREATININE 0.79 0.82  CALCIUM 9.9 8.0*   Studies/Results:  Anti-infectives: Anti-infectives (From admission, onward)   Start     Dose/Rate Route Frequency Ordered Stop   09/21/17 1800  cefTRIAXone (ROCEPHIN) 2 g in dextrose 5 % 50 mL IVPB     2 g 100 mL/hr over 30 Minutes Intravenous Every 24 hours 09/20/17 2134     09/20/17 1715  cefTRIAXone (ROCEPHIN) 2 g in dextrose 5 % 50 mL IVPB     2 g 100 mL/hr over 30 Minutes Intravenous  Once 09/20/17 1706 09/20/17 1924       Assessment/Plan: Patient Active Problem List   Diagnosis Date Noted  . Cholecystitis 09/20/2017  . Right upper quadrant pain 08/04/2017  . Non-intractable vomiting with nausea 08/04/2017  . Gastroesophageal reflux disease without esophagitis 08/04/2017  . Dysfunctional uterine bleeding 03/11/2017  . Dysmenorrhea 02/20/2015  . Frequent headaches 02/20/2015  . Osteochondroma 01/05/2015  . Menometrorrhagia 10/08/2013   21yo lady with biliary dyskinesia, possible  acalculous cholecystitis  -OR this admission for laparoscopic vs open cholecystectomy; all other indicated procedures -NPO for time being - possible diet later today if we are unable to get her surgery completed today due to scheduling conflicts and OR staffing availability   LOS: 0 days   Sharon Mt. Dema Severin, M.D. [facility]

## 2017-09-22 ENCOUNTER — Encounter (HOSPITAL_COMMUNITY): Payer: Self-pay | Admitting: Certified Registered"

## 2017-09-22 ENCOUNTER — Observation Stay (HOSPITAL_COMMUNITY): Payer: 59 | Admitting: Certified Registered"

## 2017-09-22 ENCOUNTER — Encounter (HOSPITAL_COMMUNITY)
Admission: EM | Disposition: A | Discharge status: Home / Self Care | Payer: Self-pay | Source: Home / Self Care | Attending: Emergency Medicine

## 2017-09-22 DIAGNOSIS — K811 Chronic cholecystitis: Secondary | ICD-10-CM | POA: Diagnosis not present

## 2017-09-22 DIAGNOSIS — K219 Gastro-esophageal reflux disease without esophagitis: Secondary | ICD-10-CM | POA: Diagnosis not present

## 2017-09-22 DIAGNOSIS — N946 Dysmenorrhea, unspecified: Secondary | ICD-10-CM | POA: Diagnosis not present

## 2017-09-22 DIAGNOSIS — K828 Other specified diseases of gallbladder: Secondary | ICD-10-CM | POA: Diagnosis not present

## 2017-09-22 DIAGNOSIS — K819 Cholecystitis, unspecified: Secondary | ICD-10-CM | POA: Diagnosis not present

## 2017-09-22 HISTORY — PX: CHOLECYSTECTOMY: SHX55

## 2017-09-22 SURGERY — LAPAROSCOPIC CHOLECYSTECTOMY
Anesthesia: General | Site: Abdomen

## 2017-09-22 MED ORDER — MIDAZOLAM HCL 2 MG/2ML IJ SOLN
INTRAMUSCULAR | Status: AC
  Filled 2017-09-22: qty 2

## 2017-09-22 MED ORDER — FENTANYL CITRATE (PF) 100 MCG/2ML IJ SOLN
INTRAMUSCULAR | Status: DC | PRN
  Administered 2017-09-22: 50 ug via INTRAVENOUS
  Administered 2017-09-22: 100 ug via INTRAVENOUS
  Administered 2017-09-22: 50 ug via INTRAVENOUS
  Administered 2017-09-22: 100 ug via INTRAVENOUS
  Administered 2017-09-22 (×4): 50 ug via INTRAVENOUS

## 2017-09-22 MED ORDER — CHLORHEXIDINE GLUCONATE CLOTH 2 % EX PADS
6.0000 | MEDICATED_PAD | Freq: Once | CUTANEOUS | Status: AC
  Administered 2017-09-22: 6 via TOPICAL

## 2017-09-22 MED ORDER — ROCURONIUM BROMIDE 100 MG/10ML IV SOLN
INTRAVENOUS | Status: DC | PRN
  Administered 2017-09-22: 50 mg via INTRAVENOUS

## 2017-09-22 MED ORDER — ONDANSETRON HCL 4 MG/2ML IJ SOLN
INTRAMUSCULAR | Status: AC
  Filled 2017-09-22: qty 2

## 2017-09-22 MED ORDER — BUPIVACAINE-EPINEPHRINE 0.25% -1:200000 IJ SOLN
INTRAMUSCULAR | Status: DC | PRN
  Administered 2017-09-22: 20 mL

## 2017-09-22 MED ORDER — SODIUM CHLORIDE 0.9 % IR SOLN
Status: DC | PRN
  Administered 2017-09-22: 1

## 2017-09-22 MED ORDER — ACETAMINOPHEN 500 MG PO TABS
1000.0000 mg | ORAL_TABLET | Freq: Four times a day (QID) | ORAL | Status: DC
  Administered 2017-09-22 – 2017-09-23 (×4): 1000 mg via ORAL
  Filled 2017-09-22 (×4): qty 2

## 2017-09-22 MED ORDER — SUGAMMADEX SODIUM 200 MG/2ML IV SOLN
INTRAVENOUS | Status: DC | PRN
  Administered 2017-09-22: 200 mg via INTRAVENOUS

## 2017-09-22 MED ORDER — LACTATED RINGERS IV SOLN: INTRAVENOUS | Status: DC

## 2017-09-22 MED ORDER — SUGAMMADEX SODIUM 200 MG/2ML IV SOLN
INTRAVENOUS | Status: AC
  Filled 2017-09-22: qty 2

## 2017-09-22 MED ORDER — BUPIVACAINE-EPINEPHRINE (PF) 0.25% -1:200000 IJ SOLN
INTRAMUSCULAR | Status: AC
  Filled 2017-09-22: qty 30

## 2017-09-22 MED ORDER — LACTATED RINGERS IV SOLN
INTRAVENOUS | Status: DC
  Administered 2017-09-22: 11:00:00 via INTRAVENOUS

## 2017-09-22 MED ORDER — LIDOCAINE 2% (20 MG/ML) 5 ML SYRINGE
INTRAMUSCULAR | Status: DC | PRN
  Administered 2017-09-22: 100 mg via INTRAVENOUS

## 2017-09-22 MED ORDER — IBUPROFEN 600 MG PO TABS
600.0000 mg | ORAL_TABLET | Freq: Four times a day (QID) | ORAL | Status: DC
  Administered 2017-09-22 – 2017-09-23 (×4): 600 mg via ORAL
  Filled 2017-09-22 (×4): qty 1

## 2017-09-22 MED ORDER — ONDANSETRON HCL 4 MG/2ML IJ SOLN
INTRAMUSCULAR | Status: DC | PRN
  Administered 2017-09-22: 4 mg via INTRAVENOUS

## 2017-09-22 MED ORDER — ROCURONIUM BROMIDE 10 MG/ML (PF) SYRINGE
PREFILLED_SYRINGE | INTRAVENOUS | Status: AC
  Filled 2017-09-22: qty 5

## 2017-09-22 MED ORDER — MIDAZOLAM HCL 5 MG/5ML IJ SOLN
INTRAMUSCULAR | Status: DC | PRN
  Administered 2017-09-22: 2 mg via INTRAVENOUS

## 2017-09-22 MED ORDER — SUCCINYLCHOLINE CHLORIDE 200 MG/10ML IV SOSY
PREFILLED_SYRINGE | INTRAVENOUS | Status: AC
  Filled 2017-09-22: qty 10

## 2017-09-22 MED ORDER — CELECOXIB 200 MG PO CAPS
200.0000 mg | ORAL_CAPSULE | ORAL | Status: AC
  Administered 2017-09-22: 200 mg via ORAL
  Filled 2017-09-22: qty 1

## 2017-09-22 MED ORDER — LACTATED RINGERS IV SOLN
INTRAVENOUS | Status: DC
  Administered 2017-09-22: 15:00:00 via INTRAVENOUS

## 2017-09-22 MED ORDER — DEXAMETHASONE SODIUM PHOSPHATE 10 MG/ML IJ SOLN
INTRAMUSCULAR | Status: AC
  Filled 2017-09-22: qty 1

## 2017-09-22 MED ORDER — SERTRALINE HCL 100 MG PO TABS: 100.0000 mg | ORAL_TABLET | Freq: Every day | ORAL | Status: DC

## 2017-09-22 MED ORDER — OXYCODONE HCL 5 MG PO TABS: 5.0000 mg | ORAL_TABLET | ORAL | Status: DC | PRN

## 2017-09-22 MED ORDER — PANTOPRAZOLE SODIUM 40 MG PO TBEC: 40.0000 mg | DELAYED_RELEASE_TABLET | Freq: Every day | ORAL | Status: DC

## 2017-09-22 MED ORDER — FENTANYL CITRATE (PF) 250 MCG/5ML IJ SOLN
INTRAMUSCULAR | Status: AC
  Filled 2017-09-22: qty 5

## 2017-09-22 MED ORDER — LACTATED RINGERS IV SOLN
INTRAVENOUS | Status: DC | PRN
  Administered 2017-09-22 (×2): via INTRAVENOUS

## 2017-09-22 MED ORDER — PHENYLEPHRINE 40 MCG/ML (10ML) SYRINGE FOR IV PUSH (FOR BLOOD PRESSURE SUPPORT)
PREFILLED_SYRINGE | INTRAVENOUS | Status: AC
  Filled 2017-09-22: qty 10

## 2017-09-22 MED ORDER — PROPOFOL 10 MG/ML IV BOLUS
INTRAVENOUS | Status: DC | PRN
  Administered 2017-09-22: 150 mg via INTRAVENOUS
  Administered 2017-09-22: 50 mg via INTRAVENOUS

## 2017-09-22 MED ORDER — DEXAMETHASONE SODIUM PHOSPHATE 4 MG/ML IJ SOLN
INTRAMUSCULAR | Status: DC | PRN
  Administered 2017-09-22: 10 mg via INTRAVENOUS

## 2017-09-22 MED ORDER — LIDOCAINE 2% (20 MG/ML) 5 ML SYRINGE
INTRAMUSCULAR | Status: AC
  Filled 2017-09-22: qty 5

## 2017-09-22 MED ORDER — PROPOFOL 10 MG/ML IV BOLUS
INTRAVENOUS | Status: AC
  Filled 2017-09-22: qty 20

## 2017-09-22 MED ORDER — HEPARIN SODIUM (PORCINE) 5000 UNIT/ML IJ SOLN
5000.0000 [IU] | Freq: Three times a day (TID) | INTRAMUSCULAR | Status: DC
  Administered 2017-09-22 – 2017-09-23 (×3): 5000 [IU] via SUBCUTANEOUS
  Filled 2017-09-22 (×3): qty 1

## 2017-09-22 MED ORDER — 0.9 % SODIUM CHLORIDE (POUR BTL) OPTIME
TOPICAL | Status: DC | PRN
  Administered 2017-09-22: 1000 mL

## 2017-09-22 MED ORDER — SIMETHICONE 80 MG PO CHEW: 40.0000 mg | CHEWABLE_TABLET | Freq: Four times a day (QID) | ORAL | Status: DC | PRN

## 2017-09-22 MED ORDER — HYDROMORPHONE HCL 1 MG/ML IJ SOLN: 0.5000 mg | INTRAMUSCULAR | Status: DC | PRN

## 2017-09-22 MED ORDER — EPHEDRINE SULFATE 50 MG/ML IJ SOLN
INTRAMUSCULAR | Status: AC
  Filled 2017-09-22: qty 1

## 2017-09-22 MED ORDER — GABAPENTIN 300 MG PO CAPS
300.0000 mg | ORAL_CAPSULE | ORAL | Status: AC
  Administered 2017-09-22: 300 mg via ORAL
  Filled 2017-09-22: qty 1

## 2017-09-22 MED ORDER — ACETAMINOPHEN 500 MG PO TABS
1000.0000 mg | ORAL_TABLET | ORAL | Status: AC
  Administered 2017-09-22: 1000 mg via ORAL
  Filled 2017-09-22: qty 2

## 2017-09-22 MED ORDER — BUPIVACAINE-EPINEPHRINE (PF) 0.5% -1:200000 IJ SOLN
INTRAMUSCULAR | Status: AC
  Filled 2017-09-22: qty 30

## 2017-09-22 MED ORDER — DOCUSATE SODIUM 100 MG PO CAPS
100.0000 mg | ORAL_CAPSULE | Freq: Two times a day (BID) | ORAL | Status: DC
  Administered 2017-09-22 – 2017-09-23 (×3): 100 mg via ORAL
  Filled 2017-09-22 (×3): qty 1

## 2017-09-22 SURGICAL SUPPLY — 37 items
APPLIER CLIP 5 13 M/L LIGAMAX5 (MISCELLANEOUS) ×3
BLADE CLIPPER SURG (BLADE) IMPLANT
CANISTER SUCT 3000ML PPV (MISCELLANEOUS) ×3 IMPLANT
CHLORAPREP W/TINT 26ML (MISCELLANEOUS) ×3 IMPLANT
CLIP APPLIE 5 13 M/L LIGAMAX5 (MISCELLANEOUS) ×1 IMPLANT
COVER SURGICAL LIGHT HANDLE (MISCELLANEOUS) ×3 IMPLANT
DERMABOND ADVANCED (GAUZE/BANDAGES/DRESSINGS) ×2
DERMABOND ADVANCED .7 DNX12 (GAUZE/BANDAGES/DRESSINGS) ×1 IMPLANT
DISSECTOR BLUNT TIP ENDO 5MM (MISCELLANEOUS) ×3 IMPLANT
ELECT REM PT RETURN 9FT ADLT (ELECTROSURGICAL) ×3
ELECTRODE REM PT RTRN 9FT ADLT (ELECTROSURGICAL) ×1 IMPLANT
GLOVE BIOGEL M STRL SZ7.5 (GLOVE) ×3 IMPLANT
GLOVE BIOGEL PI IND STRL 8 (GLOVE) ×1 IMPLANT
GLOVE BIOGEL PI IND STRL 8.5 (GLOVE) ×1 IMPLANT
GLOVE BIOGEL PI INDICATOR 8 (GLOVE) ×2
GLOVE BIOGEL PI INDICATOR 8.5 (GLOVE) ×2
GLOVE SURG SS PI 8.0 STRL IVOR (GLOVE) ×3 IMPLANT
GOWN STRL REUS W/ TWL LRG LVL3 (GOWN DISPOSABLE) ×2 IMPLANT
GOWN STRL REUS W/ TWL XL LVL3 (GOWN DISPOSABLE) ×1 IMPLANT
GOWN STRL REUS W/TWL LRG LVL3 (GOWN DISPOSABLE) ×4
GOWN STRL REUS W/TWL XL LVL3 (GOWN DISPOSABLE) ×2
KIT BASIN OR (CUSTOM PROCEDURE TRAY) ×3 IMPLANT
KIT ROOM TURNOVER OR (KITS) ×3 IMPLANT
NS IRRIG 1000ML POUR BTL (IV SOLUTION) ×3 IMPLANT
PAD ARMBOARD 7.5X6 YLW CONV (MISCELLANEOUS) ×3 IMPLANT
POUCH SPECIMEN RETRIEVAL 10MM (ENDOMECHANICALS) IMPLANT
SCISSORS LAP 5X35 DISP (ENDOMECHANICALS) ×3 IMPLANT
SET IRRIG TUBING LAPAROSCOPIC (IRRIGATION / IRRIGATOR) ×3 IMPLANT
SLEEVE ENDOPATH XCEL 5M (ENDOMECHANICALS) ×6 IMPLANT
SPECIMEN JAR SMALL (MISCELLANEOUS) ×3 IMPLANT
SUT MNCRL AB 4-0 PS2 18 (SUTURE) ×3 IMPLANT
TOWEL OR 17X24 6PK STRL BLUE (TOWEL DISPOSABLE) ×3 IMPLANT
TOWEL OR 17X26 10 PK STRL BLUE (TOWEL DISPOSABLE) ×3 IMPLANT
TRAY LAPAROSCOPIC MC (CUSTOM PROCEDURE TRAY) ×3 IMPLANT
TROCAR XCEL BLUNT TIP 100MML (ENDOMECHANICALS) ×3 IMPLANT
TROCAR XCEL NON-BLD 5MMX100MML (ENDOMECHANICALS) ×3 IMPLANT
TUBING INSUFFLATION (TUBING) ×3 IMPLANT

## 2017-09-22 NOTE — Anesthesia Postprocedure Evaluation (Signed)
Anesthesia Post Note  Patient: Alyssa Cunningham  Procedure(s) Performed: LAPAROSCOPIC CHOLECYSTECTOMY (N/A Abdomen)     Patient location during evaluation: PACU Anesthesia Type: General Level of consciousness: awake and alert Pain management: pain level controlled Vital Signs Assessment: post-procedure vital signs reviewed and stable Respiratory status: spontaneous breathing, nonlabored ventilation, respiratory function stable and patient connected to nasal cannula oxygen Cardiovascular status: blood pressure returned to baseline and stable Postop Assessment: no apparent nausea or vomiting Anesthetic complications: no    Last Vitals:  Vitals:   09/22/17 1354 09/22/17 1736  BP: 123/72 120/63  Pulse: 61 70  Resp: 16 18  Temp: 36.8 C 36.6 C  SpO2: 100% 100%    Last Pain:  Vitals:   09/22/17 1736  TempSrc: Oral  PainSc:                  Paxten Appelt DAVID

## 2017-09-22 NOTE — Anesthesia Procedure Notes (Signed)
Procedure Name: Intubation Date/Time: 09/22/2017 11:48 AM Performed by: Orlie Dakin, CRNA Pre-anesthesia Checklist: Patient identified, Emergency Drugs available, Suction available, Patient being monitored and Timeout performed Patient Re-evaluated:Patient Re-evaluated prior to induction Oxygen Delivery Method: Circle system utilized Preoxygenation: Pre-oxygenation with 100% oxygen Induction Type: IV induction Ventilation: Mask ventilation without difficulty Laryngoscope Size: Mac and 3 Grade View: Grade I Tube type: Oral Tube size: 7.0 mm Number of attempts: 1 Airway Equipment and Method: Stylet Placement Confirmation: ETT inserted through vocal cords under direct vision,  positive ETCO2 and breath sounds checked- equal and bilateral Secured at: 23 cm Tube secured with: Tape Dental Injury: Teeth and Oropharynx as per pre-operative assessment  Comments: 4x4s bite block used, DL and intubation per Charolette Forward CRNA

## 2017-09-22 NOTE — Progress Notes (Signed)
Subjective No acute events. Doing well. Pain improved. Denies n/v.  Objective: Vital signs in last 24 hours: Temp:  [98.5 F (36.9 C)-98.8 F (37.1 C)] 98.5 F (36.9 C) (12/27 0516) Pulse Rate:  [56-59] 58 (12/27 0516) Resp:  [17-18] 17 (12/27 0516) BP: (110-126)/(44-73) 110/58 (12/27 0516) SpO2:  [100 %] 100 % (12/27 0516) Last BM Date: 09/20/17  Intake/Output from previous day: 12/26 0701 - 12/27 0700 In: 3202.5 [P.O.:240; I.V.:2962.5] Out: -  Intake/Output this shift: No intake/output data recorded.  Gen: NAD, comfortable CV: RRR Pulm: Normal work of breathing Abd: Soft, mildly ttp in RUQ; no r/g Ext: SCDs in place  Lab Results: CBC  Recent Labs    09/20/17 1251 09/21/17 0828  WBC 15.4* 6.7  HGB 15.5* 11.7*  HCT 44.4 35.3*  PLT 329 215   BMET Recent Labs    09/20/17 1251 09/21/17 0347  NA 138 139  K 3.2* 3.9  CL 106 112*  CO2 17* 23  GLUCOSE 104* 89  BUN 10 8  CREATININE 0.79 0.82  CALCIUM 9.9 8.0*    Anti-infectives: Anti-infectives (From admission, onward)   Start     Dose/Rate Route Frequency Ordered Stop   09/21/17 1800  cefTRIAXone (ROCEPHIN) 2 g in dextrose 5 % 50 mL IVPB     2 g 100 mL/hr over 30 Minutes Intravenous Every 24 hours 09/20/17 2134     09/20/17 1715  cefTRIAXone (ROCEPHIN) 2 g in dextrose 5 % 50 mL IVPB     2 g 100 mL/hr over 30 Minutes Intravenous  Once 09/20/17 1706 09/20/17 1924       Assessment/Plan: Patient Active Problem List   Diagnosis Date Noted  . Cholecystitis 09/20/2017  . Right upper quadrant pain 08/04/2017  . Non-intractable vomiting with nausea 08/04/2017  . Gastroesophageal reflux disease without esophagitis 08/04/2017  . Dysfunctional uterine bleeding 03/11/2017  . Dysmenorrhea 02/20/2015  . Frequent headaches 02/20/2015  . Osteochondroma 01/05/2015  . Menometrorrhagia 10/08/2013   Planning OR today for LAPAROSCOPIC VS OPEN CHOLECYSTECTOMY; all other indicated procedures  -NPO, IVF, IV abx -The  anatomy & physiology of hepatobiliary & pancreatic function was discussed.  The pathophysiology of gallbladder dysfunction was discussed.  Natural history risks without surgery was discussed.   I feel the risks of no intervention will lead to serious problems that outweigh the operative risks; therefore, I recommended cholecystectomy to address the pathology.  I explained laparoscopic techniques with possible need for an open approach.  Possible cholangiogram to evaluate the bilary tract was explained as well.    The planned procedure, material risks (including but not limited to pain, bleeding, infection, scarring, abscess, bile leak, injury to other organs/viscus/nerves/vessels/bile duct, need for additional procedures, hernia, heart attack, stroke, death) benefits and alternatives to surgery were discussed.  I noted a good likelihood this will help address the problem.  Possibility that this will not correct all abdominal symptoms was explained.  Goals of post-operative recovery were discussed as well.  Her questions were answered to her satisfaction and she elected to proceed with surgery.  Sharon Mt. Dema Severin, M.D. General and Colorectal Surgery [facility]

## 2017-09-22 NOTE — Transfer of Care (Signed)
Immediate Anesthesia Transfer of Care Note  Patient: Alyssa Cunningham  Procedure(s) Performed: LAPAROSCOPIC CHOLECYSTECTOMY (N/A Abdomen)  Patient Location: PACU  Anesthesia Type:General  Level of Consciousness: awake and patient cooperative  Airway & Oxygen Therapy: Patient Spontanous Breathing and Patient connected to nasal cannula oxygen  Post-op Assessment: Report given to RN and Post -op Vital signs reviewed and stable  Post vital signs: Reviewed and stable  Last Vitals:  Vitals:   09/22/17 0516 09/22/17 1313  BP: (!) 110/58 (!) 147/85  Pulse: (!) 58 78  Resp: 17 10  Temp: 36.9 C 36.7 C  SpO2: 100% 100%    Last Pain:  Vitals:   09/22/17 1313  TempSrc:   PainSc: 7          Complications: No apparent anesthesia complications

## 2017-09-22 NOTE — Progress Notes (Signed)
[id number] Patient taken down to OR for laparoscopic cholecystectomy.

## 2017-09-22 NOTE — Anesthesia Preprocedure Evaluation (Addendum)
Anesthesia Evaluation  Patient identified by MRN, date of birth, ID band Patient awake    Reviewed: Allergy & Precautions, NPO status , Patient's Chart, lab work & pertinent test results  Airway Mallampati: I  TM Distance: >3 FB Neck ROM: Full    Dental   Pulmonary    Pulmonary exam normal        Cardiovascular Normal cardiovascular exam     Neuro/Psych    GI/Hepatic GERD  Medicated and Controlled,  Endo/Other    Renal/GU      Musculoskeletal   Abdominal   Peds  Hematology   Anesthesia Other Findings   Reproductive/Obstetrics                             Anesthesia Physical Anesthesia Plan  ASA: II  Anesthesia Plan: General   Post-op Pain Management:    Induction: Intravenous  PONV Risk Score and Plan: 3 and Ondansetron and Treatment may vary due to age or medical condition  Airway Management Planned: Oral ETT  Additional Equipment:   Intra-op Plan:   Post-operative Plan: Extubation in OR  Informed Consent: I have reviewed the patients History and Physical, chart, labs and discussed the procedure including the risks, benefits and alternatives for the proposed anesthesia with the patient or authorized representative who has indicated his/her understanding and acceptance.       Plan Discussed with: CRNA and Surgeon  Anesthesia Plan Comments:         Anesthesia Quick Evaluation  

## 2017-09-22 NOTE — Op Note (Signed)
09/22/2017 12:58 PM  PATIENT: Alyssa Cunningham  21 y.o. female  Patient Care Team: [name], DO as PCP - General (Family Medicine) Michael Boston, MD as Consulting Physician (General Surgery) Ladene Artist, MD as Consulting Physician (Gastroenterology)  PRE-OPERATIVE DIAGNOSIS: cholecystitis  POST-OPERATIVE DIAGNOSIS: cholecystitis  PROCEDURE: Laparoscopic cholecystectomy  SURGEON: Sharon Mt. Lacinda Curvin, MD  ASSISTANT: none   ANESTHESIA: General endotracheal  EBL: Total I/O In: 1000 [I.V.:1000] Out: 10 [Blood:10]  DRAINS: None  SPECIMEN: Gallbladder  COUNTS: Sponge, needle and instrument counts were reported correct x2 at the conclusion of the operation  DISPOSITION: PACU in satisfactory condition  FINDINGS: Minimally inflamed gallbladder - mild pericholecystic edema. Critical view of safety obtained prior to clipping or dividing any structures.  INDICATION: Ms. Mccomas is a very pleasant 21yo lady who was admitted 12/25/184 motor quadrant abdominal pain, nausea, vomiting.  The symptoms have been occurring for approximately 7 months.  They were primarily pain and nausea in the right upper quadrant.  She had a HIDA scan performed that showed a gallbladder ejection fraction of 22%.  Her right upper quadrant ultrasound was negative for stones.  She saw my partner Dr. gross in the office and laparoscopic cholecystectomy had been scheduled however due to nausea and ongoing pain she is unable to wait for surgical date.  Her liver enzymes as well as total bilirubin were normal.  Her lipase was 22.  She was admitted with the diagnosis of biliary dyskinesia and probable acalculous cholecystitis.  The anatomy & physiology of hepatobiliary & pancreatic function was discussed. The pathophysiology of gallbladder dysfunction was discussed. Natural history and material risks without surgery was discussed. I feel the risks of no intervention will lead to serious problems that outweigh the  operative risks; therefore, I recommended cholecystectomy to remove the probable pathology. I explained laparoscopic techniques with possible need for an open approach. Additionally, I discussed the possible need for a cholangiogram to evaluate the bilary tract, if indicated.  The procedure, material risks (including but not limited to pain, bleeding; infection; scarring; abscess; bile leak; injury to other organs such as bowel, blood vessels, nerves, bile duct; hernia; the need for additional procedures; heart attack; stroke; death), benefits and alternatives to surgery were discussed at length. The patient's questions were answered to their satisfaction and the patient elected to proceed with surgery.  I noted a good likelihood this will help address the problem.  Possibility that this will not correct all abdominal symptoms was also explained.  Goals of post-operative recovery were discussed as well.     DESCRIPTION: The patient was identified & brought into the operating room. The patient was positioned supine on the OR table. SCDs were in place and active during the entire case. The patient underwent general endotracheal anesthesia. The abdomen was prepped and draped in the standard sterile fashion and antibiotics were administered. A surgical timeout was performed and confirmed our plan.   A periumbilical incision was made. The umbilical stalk was grasped and retracted outwardly. The infraumbilical fascia was identified and incised. The peritoneal cavity was gently entered bluntly. A purse-string 0 Vicryl suture was placed. The Hasson cannula was inserted into the peritoneal cavity and insufflation with CO2 commenced to 86mmHg. A laparoscope was inserted into the peritoneal cavity and inspection confirmed no evidence of trocar site complications. The patient was then positioned in reverse Trendelenburg with the left side down. 3 additional 35mm trocars were placed along the right subcostal line - one 39mm  port in mid subcostal  region, another 8mm port in the right flank near the anterior axillary line, and a third 76mm port in the left subxiphoid region obliquely near the falciform ligament.  The liver and gallbladder were inspected. The gallbladder fundus was grasped and elevated cephalad. An additional grasper was then placed on the infundibulum of the gallbladder and the infundibulum was retracted laterally. Gentle blunt dissection was then employed with a IT consultant working down into [employer]. The peritoneum on both sides of the gallbladder was opened with hook cautery. The cystic duct was identified, carefully circumferentially dissected. The cystic artery was then carefully circumferentially dissected. The space between the cystic artery and hepatocystic plate was developed such that the liver could be seen through a window medial to the cystic artery. The triangle of Calot was cleared of all fibrofatty tissue. At this point, a critical view of safety was achieved and the only structures visualized was the skeletonized cystic duct laterally, the skeletonized cystic artery and the liver through the window medial to the artery.   The cystic duct and artery were clipped with 2 clips on the "stay" side and 1 clip on the specimen side. The cystic duct and artery were then divided. The gallbladder was then freed from its remaining attachments to the liver using electrocautery and placed into an endocatch bag. Hemostasis was achieved and then re-verified. The rest of the abdomen was inspected no injury nor bleeding elsewhere was identified. The RUQ was irrigated with normal saline. The clips and gallbladder fossa were reinspected and noted to be in place and hemostatic.  The gallbladder and endocatch bag were then removed from the umbilical port site and passed off as specimen. The RUQ ports were removed under direct visualization and noted to be hemostatic. The umbilical fascia was then closed  using 0 Vicryl suture. The skin of all incision sites was approximated with 4-0 monocryl subcuticular suture and dermabond applied. The patient was then extubated and transferred to a stretcher for transport to PACU in satisfactory condition.

## 2017-09-23 ENCOUNTER — Encounter (HOSPITAL_COMMUNITY): Payer: Self-pay | Admitting: Surgery

## 2017-09-23 LAB — COMPREHENSIVE METABOLIC PANEL
ALBUMIN: 3.6 g/dL (ref 3.5–5.0)
ALK PHOS: 45 U/L (ref 38–126)
ALT: 35 U/L (ref 14–54)
ANION GAP: 11 (ref 5–15)
AST: 47 U/L — AB (ref 15–41)
BILIRUBIN TOTAL: 1.1 mg/dL (ref 0.3–1.2)
BUN: 6 mg/dL (ref 6–20)
CALCIUM: 8.8 mg/dL — AB (ref 8.9–10.3)
CO2: 21 mmol/L — ABNORMAL LOW (ref 22–32)
CREATININE: 0.8 mg/dL (ref 0.44–1.00)
Chloride: 105 mmol/L (ref 101–111)
GFR calc Af Amer: >60 mL/min (ref >60)
GFR calc non Af Amer: >60 mL/min (ref >60)
GLUCOSE: 106 mg/dL — AB (ref 65–99)
Potassium: 3.5 mmol/L (ref 3.5–5.1)
Sodium: 137 mmol/L (ref 135–145)
TOTAL PROTEIN: 5.9 g/dL — AB (ref 6.5–8.1)

## 2017-09-23 LAB — CBC
HCT: 39.7 % (ref 36.0–46.0)
Hemoglobin: 13.2 g/dL (ref 12.0–15.0)
MCH: 29.1 pg (ref 26.0–34.0)
MCHC: 33.2 g/dL (ref 30.0–36.0)
MCV: 87.6 fL (ref 78.0–100.0)
Platelets: 235 10*3/uL (ref 150–400)
RBC: 4.53 MIL/uL (ref 3.87–5.11)
RDW: 12.6 % (ref 11.5–15.5)
WBC: 9.8 10*3/uL (ref 4.0–10.5)

## 2017-09-23 MED ORDER — APAP 325 MG PO TABS: ORAL_TABLET | ORAL | Status: AC

## 2017-09-23 MED ORDER — OXYCODONE HCL 5 MG PO TABS: 5.0000 mg | ORAL_TABLET | ORAL | 0 refills | Status: DC | PRN

## 2017-09-23 MED ORDER — IBUPROFEN 200 MG PO TABS
ORAL_TABLET | ORAL | Status: DC
Start: 2017-09-23 — End: 2017-11-12

## 2017-09-23 NOTE — Discharge Instructions (Signed)
[facility] ______[facility] °LAPAROSCOPIC SURGERY: POST OP INSTRUCTIONS °Always review your discharge instruction sheet given to you by the facility where your surgery was performed. °IF YOU HAVE DISABILITY OR FAMILY LEAVE FORMS, YOU MUST BRING THEM TO THE OFFICE FOR PROCESSING.   °DO NOT GIVE THEM TO YOUR DOCTOR. ° °1. A prescription for pain medication may be given to you upon discharge.  Take your pain medication as prescribed, if needed.  If narcotic pain medicine is not needed, then you may take acetaminophen (Tylenol) or ibuprofen (Advil) as needed. °2. Take your usually prescribed medications unless otherwise directed. °3. If you need a refill on your pain medication, please contact your pharmacy.  They will contact our office to request authorization. Prescriptions will not be filled after 5pm or on week-ends. °4. You should follow a light diet the first few days after arrival home, such as soup and crackers, etc.  Be sure to include lots of fluids daily. °5. Most patients will experience some swelling and bruising in the area of the incisions.  Ice packs will help.  Swelling and bruising can take several days to resolve.  °6. It is common to experience some constipation if taking pain medication after surgery.  Increasing fluid intake and taking a stool softener (such as Colace) will usually help or prevent this problem from occurring.  A mild laxative (Milk of Magnesia or Miralax) should be taken according to package instructions if there are no bowel movements after 48 hours. °7. Unless discharge instructions indicate otherwise, you may remove your bandages 24-48 hours after surgery, and you may shower at that time.  You may have steri-strips (small skin tapes) in place directly over the incision.  These strips should be left on the skin for 7-10 days.  If your surgeon used skin glue on the incision, you may shower in 24 hours.  The glue will flake off over the next 2-3 weeks.  Any sutures or  staples will be removed at the office during your follow-up visit. °8. ACTIVITIES:  You may resume regular (light) daily activities beginning the next day--such as daily self-care, walking, climbing stairs--gradually increasing activities as tolerated.  You may have sexual intercourse when it is comfortable.  Refrain from any heavy lifting or straining until approved by your doctor. °a. You may drive when you are no longer taking prescription pain medication, you can comfortably wear a seatbelt, and you can safely maneuver your car and apply brakes. °b. RETURN TO WORK:  __________________________________________________________ °9. You should see your doctor in the office for a follow-up appointment approximately 2-3 weeks after your surgery.  Make sure that you call for this appointment within a day or two after you arrive home to insure a convenient appointment time. °10. OTHER INSTRUCTIONS: __________________________________________________________________________________________________________________________ __________________________________________________________________________________________________________________________ °WHEN TO CALL YOUR DOCTOR: °1. Fever over 101.0 °2. Inability to urinate °3. Continued bleeding from incision. °4. Increased pain, redness, or drainage from the incision. °5. Increasing abdominal pain ° °The clinic staff is available to answer your questions during regular business hours.  Please don’t hesitate to call and ask to speak to one of the nurses for clinical concerns.  If you have a medical emergency, go to the nearest emergency room or call 911.  A surgeon from [facility] is always on call at the hospital. °[address], [address], [place], [place]  [address] ? [address] [address], [place], [place]   [address] °[phone] ? [phone] ? FAX [phone] °Web site:   [email or web address] °

## 2017-09-23 NOTE — Progress Notes (Signed)
Pt is ambulating to the hall, tolerating regular diet. Surgical pain is controlled. Discharge instructions given to pt. Discharged home accompanied by mother.

## 2017-09-23 NOTE — Progress Notes (Signed)
1 Day Post-Op    CC: Abdominal pain  Subjective: She looks great tolerating a diet and p.o. pain medicines are controlling her pain.  Objective: Vital signs in last 24 hours: Temp:  [97.8 F (36.6 C)-98.8 F (37.1 C)] 98.8 F (37.1 C) (12/28 0415) Pulse Rate:  [54-78] 61 (12/28 0415) Resp:  [10-21] 20 (12/28 0415) BP: (116-147)/(62-85) 116/69 (12/28 0415) SpO2:  [99 %-100 %] 100 % (12/28 0415) Last BM Date: 09/20/17 600 PO 3900 IV Voided x 5 Afebrile vital signs are stable Labs are all good.   Intake/Output from previous day: 12/27 0701 - 12/28 0700 In: 4581.8 [P.O.:600; I.V.:3881.8; IV Piggyback:50] Out: 10 [Blood:10] Intake/Output this shift: Total I/O In: 230 [P.O.:230] Out: -   General appearance: alert, cooperative and no distress GI: Soft, sore, port sites look fine.  Lab Results:  Recent Labs    09/21/17 0828 09/23/17 0517  WBC 6.7 9.8  HGB 11.7* 13.2  HCT 35.3* 39.7  PLT 215 235    BMET Recent Labs    09/21/17 0347 09/23/17 0517  NA 139 137  K 3.9 3.5  CL 112* 105  CO2 23 21*  GLUCOSE 89 106*  BUN 8 6  CREATININE 0.82 0.80  CALCIUM 8.0* 8.8*   PT/INR No results for input(s): LABPROT, INR in the last 72 hours.  Recent Labs  Lab 09/20/17 1251 09/21/17 0347 09/23/17 0517  AST 22 15 47*  ALT 17 11* 35  ALKPHOS 64 39 45  BILITOT 1.9* 0.9 1.1  PROT 8.0 5.1* 5.9*  ALBUMIN 4.8 3.1* 3.6     Lipase     Component Value Date/Time   LIPASE 22 09/20/2017 1251   Prior to Admission medications   Medication Sig Start Date End Date Taking? Authorizing Provider  Norethindrone Acetate-Ethinyl Estradiol (JUNEL 1.5/30) 1.5-30 MG-MCG tablet Take 1 tablet by mouth daily. 05/16/17  Yes Martinique, Betty G, MD  pantoprazole (PROTONIX) 40 MG tablet Take 1 tablet (40 mg total) daily by mouth. 08/04/17  Yes Zehr, Laban Emperor, PA-C  sertraline (ZOLOFT) 100 MG tablet Take 1 tablet (100 mg total) by mouth daily. 05/27/17  Yes Colin Benton R, DO  ondansetron  (ZOFRAN-ODT) 4 MG disintegrating tablet Take 1 tablet (4 mg total) by mouth every 8 (eight) hours as needed for nausea or vomiting. Patient not taking: Reported on 09/20/2017 07/08/17   [name], DO      Medications: . acetaminophen  1,000 mg Oral Q6H  . docusate sodium  100 mg Oral BID  . feeding supplement  1 Container Oral TID BM  . heparin injection (subcutaneous)  5,000 Units Subcutaneous Q8H  . ibuprofen  600 mg Oral Q6H  . pantoprazole  40 mg Oral Daily  . sertraline  100 mg Oral Daily   . 0.9 % NaCl with KCl 20 mEq / L Stopped (09/22/17 2326)  . cefTRIAXone (ROCEPHIN)  IV Stopped (09/22/17 1827)  . lactated ringers 10 mL/hr at 09/22/17 1057  . lactated ringers    . lactated ringers 75 mL/hr at 09/22/17 1439    Assessment/Plan Cholecystitis Status post laparoscopic cholecystectomy 09/22/17, Dr. Nadeen Landau  GERD  FEN: IV fluids/regular diet ID: Rocephin preop DVT: SCDs Foley: None Follow-up: [facility]  Plan discharge home follow-up in the [place] [facility].  LOS: 0 days    Indria Bishara 09/23/2017 [phone]

## 2017-09-26 ENCOUNTER — Telehealth: Payer: Self-pay | Admitting: *Deleted

## 2017-09-26 NOTE — Telephone Encounter (Signed)
Unable to reach patient at time of TCM Call. Left message for patient to return call when available.  

## 2017-09-28 NOTE — Telephone Encounter (Signed)
Unable to reach patient at time of TCM Call. Left message for patient to return call when available.  

## 2017-09-29 NOTE — Discharge Summary (Signed)
Physician Discharge Summary  Patient ID: Alyssa Cunningham MRN: [id number] DOB/AGE: [birth year] 23 y.o.  Admit date: 09/20/2017 Discharge date: 09/23/17  Admission Diagnoses:  Biliary dyskinesia  cholecystitis GERD  Discharge Diagnoses:  Same  Active Problems:   Cholecystitis   PROCEDURES: Laparoscopic cholecystectomy 09/22/17 Dr. Tana Conch Course:  This is a 22 year old female who presents with right upper quadrant abdominal pain, nausea, and vomiting.  She is actually been having symptoms for approximately 7 months.  These were mostly pain and nausea.  She had a HIDA scan showing a 22% gallbladder ejection fraction.  Ultrasound was negative for stones.  She saw Dr. Johney Maine in our office and a laparoscopic cholecystectomy has been scheduled.  Over the last 2 days, however, she has developed worsening abdominal pain with nausea and vomiting.  The pain is described as moderate to severe with cramping.  It does not refer anywhere else. She was found to have elevated white blood count.  Ultrasound of the gallbladder was fairly unremarkable  Patient was seen in the ED and admitted by Dr. Ninfa Linden on 09/20/17.  She was taken to the operating room on 09/22/17 underwent laparoscopic cholecystectomy.  Following postoperative morning she was awake and alert.  She was tolerating soft diet ambulating and was ready for discharge that a.m.  CBC Latest Ref Rng & Units 09/23/2017 09/21/2017 09/20/2017  WBC 4.0 - 10.5 K/uL 9.8 6.7 15.4(H)  Hemoglobin 12.0 - 15.0 g/dL 13.2 11.7(L) 15.5(H)  Hematocrit 36.0 - 46.0 % 39.7 35.3(L) 44.4  Platelets 150 - 400 K/uL 235 215 329   CMP Latest Ref Rng & Units 09/23/2017 09/21/2017 09/20/2017  Glucose 65 - 99 mg/dL 106(H) 89 104(H)  BUN 6 - 20 mg/dL 6 8 10   Creatinine 0.44 - 1.00 mg/dL 0.80 0.82 0.79  Sodium 135 - 145 mmol/L 137 139 138  Potassium 3.5 - 5.1 mmol/L 3.5 3.9 3.2(L)  Chloride 101 - 111 mmol/L 105 112(H) 106  CO2 22 - 32 mmol/L 21(L)  23 17(L)  Calcium 8.9 - 10.3 mg/dL 8.8(L) 8.0(L) 9.9  Total Protein 6.5 - 8.1 g/dL 5.9(L) 5.1(L) 8.0  Total Bilirubin 0.3 - 1.2 mg/dL 1.1 0.9 1.9(H)  Alkaline Phos 38 - 126 U/L 45 39 64  AST 15 - 41 U/L 47(H) 15 22  ALT 14 - 54 U/L 35 11(L) 17   Condition on discharge: Improved   Disposition: 01-Home or Self Care   Allergies as of 09/23/2017   No Known Allergies     Medication List    TAKE these medications   APAP 325 MG tablet You can take 2 tablets every 4 hours as needed for pain.  You can alternate this with ibuprofen or the oxycodone.   ibuprofen 200 MG tablet Commonly known as:  ADVIL,MOTRIN You can take 2-3 tablets every 6 hours as needed for pain.  You can alternate this with plain Tylenol or oxycodone.   Norethindrone Acetate-Ethinyl Estradiol 1.5-30 MG-MCG tablet Commonly known as:  JUNEL 1.5/30 Take 1 tablet by mouth daily.   ondansetron 4 MG disintegrating tablet Commonly known as:  ZOFRAN-ODT Take 1 tablet (4 mg total) by mouth every 8 (eight) hours as needed for nausea or vomiting.   oxyCODONE 5 MG immediate release tablet Commonly known as:  Oxy IR/ROXICODONE Take 1-2 tablets (5-10 mg total) by mouth every 4 (four) hours as needed for moderate pain.   pantoprazole 40 MG tablet Commonly known as:  PROTONIX Take 1 tablet (40 mg total) daily by mouth.  sertraline 100 MG tablet Commonly known as:  ZOLOFT Take 1 tablet (100 mg total) by mouth daily.      Follow-up Information    [facility].   Specialty:  General Surgery Why:  Our office will call with an appointment in the [place] clinic within the next 2 weeks.  Bring insurance information and photo ID to office visit.  Check in 30 minutes ahead of schedule an appointment. Contact information: [address] [place] [address] [phone]           Signed: Earnstine Regal 09/29/2017, 11:35 AM

## 2017-10-06 ENCOUNTER — Encounter: Payer: Self-pay | Admitting: Family Medicine

## 2017-10-07 ENCOUNTER — Ambulatory Visit: Payer: Self-pay | Admitting: Family Medicine

## 2017-10-07 ENCOUNTER — Encounter: Payer: Self-pay | Admitting: Family Medicine

## 2017-10-07 ENCOUNTER — Ambulatory Visit (INDEPENDENT_AMBULATORY_CARE_PROVIDER_SITE_OTHER): Payer: 59 | Admitting: Family Medicine

## 2017-10-07 VITALS — BP 122/80 | HR 90 | Temp 99.6°F | Ht 69.02 in | Wt 159.8 lb

## 2017-10-07 DIAGNOSIS — R109 Unspecified abdominal pain: Secondary | ICD-10-CM

## 2017-10-07 DIAGNOSIS — F419 Anxiety disorder, unspecified: Secondary | ICD-10-CM | POA: Diagnosis not present

## 2017-10-07 DIAGNOSIS — R112 Nausea with vomiting, unspecified: Secondary | ICD-10-CM | POA: Diagnosis not present

## 2017-10-07 MED ORDER — ONDANSETRON 4 MG PO TBDP: 4.0000 mg | ORAL_TABLET | Freq: Three times a day (TID) | ORAL | 0 refills | Status: DC | PRN

## 2017-10-07 NOTE — Patient Instructions (Signed)
Call your surgeon's/GI offices today about your symptoms.  Seek emergency care if repetitive vomiting returns or symptoms worsening.  Plenty of fluids.  No dairy.

## 2017-10-07 NOTE — Progress Notes (Signed)
HPI:  Here for four-month follow-up visit.  A lot has happened in the last 4 months.  She now is seeing psychiatry and [facility] and a psychologist in [place] where she lives for the mood disorder/anxiety.  Anxiety is worsened over the last several weeks and she is waiting to see a psychiatrist in [place].  She is seeing student health about this.  She wants my thoughts on whether or not you can become immune to the Zoloft.  No suicidal ideation. She recently had a cholecystectomy in the last few weeks.  Since discharge from the hospital she has not been feeling great.  She has had some intermittent vomiting.  She had a little bit of loose stool and multiple episodes of vomiting with nausea a few days ago.  She has not vomited in the 24 hours.  She also has had some intermittent pain in the right upper quadrant since her discharge.  Denies fevers, dysuria, body aches, hematochezia, melena, hematemesis.  She is out of the nausea medicines that they discharged her with.  She wonders if I can refill this.  She is tolerating fluids and some soup well today with no emesis.  She has not contacted her surgeon or her gastroenterologist about these symptoms.   ROS: See pertinent positives and negatives per HPI.  Past Medical History:  Diagnosis Date  . Frequent headaches    since age 30,  . Menorrhagia    DUB, dysmenorrhea since 2014  . Migraine    "q couple weeks" (09/21/2017)  . Osteochondroma    sees [place] [facility]  . Right knee pain    "knee cap is off track" (09/21/2017)    Past Surgical History:  Procedure Laterality Date  . CHOLECYSTECTOMY N/A 09/22/2017   Procedure: LAPAROSCOPIC CHOLECYSTECTOMY;  Surgeon: Ileana Roup, MD;  Location: [facility];  Service: General;  Laterality: N/A;  . WISDOM TOOTH EXTRACTION  2015    Family History  Adopted: Yes  Family history unknown: Yes    Social History   Socioeconomic History  . Marital status: Single    Spouse name: None  . Number of  children: None  . Years of education: None  . Highest education level: None  Social Needs  . Financial resource strain: None  . Food insecurity - worry: None  . Food insecurity - inability: None  . Transportation needs - medical: None  . Transportation needs - non-medical: None  Occupational History  . None  Tobacco Use  . Smoking status: Never Smoker  . Smokeless tobacco: Never Used  Substance and Sexual Activity  . Alcohol use: No  . Drug use: No  . Sexual activity: No  Other Topics Concern  . None  Social History Narrative   Work or School: Going to school in June      Home Situation:       Spiritual Beliefs: Christian      Lifestyle: no regular exercise; diet is healthy        Current Outpatient Medications:  .  acetaminophen 325 MG tablet, You can take 2 tablets every 4 hours as needed for pain.  You can alternate this with ibuprofen or the oxycodone., Disp: , Rfl:  .  ibuprofen (ADVIL,MOTRIN) 200 MG tablet, You can take 2-3 tablets every 6 hours as needed for pain.  You can alternate this with plain Tylenol or oxycodone., Disp: , Rfl:  .  Norethindrone Acetate-Ethinyl Estradiol (JUNEL 1.5/30) 1.5-30 MG-MCG tablet, Take 1 tablet by mouth daily., Disp: 3  Package, Rfl: 2 .  oxyCODONE (OXY IR/ROXICODONE) 5 MG immediate release tablet, Take 1-2 tablets (5-10 mg total) by mouth every 4 (four) hours as needed for moderate pain., Disp: 15 tablet, Rfl: 0 .  pantoprazole (PROTONIX) 40 MG tablet, Take 1 tablet (40 mg total) daily by mouth., Disp: 30 tablet, Rfl: 3 .  sertraline (ZOLOFT) 100 MG tablet, Take 1 tablet (100 mg total) by mouth daily., Disp: 90 tablet, Rfl: 0 .  ondansetron (ZOFRAN ODT) 4 MG disintegrating tablet, Take 1 tablet (4 mg total) by mouth every 8 (eight) hours as needed for nausea or vomiting., Disp: 10 tablet, Rfl: 0  EXAM:  Vitals:   10/07/17 1446  BP: 122/80  Pulse: 90  Temp: 99.6 F (37.6 C)    Body mass index is 23.58 kg/m.  GENERAL: vitals  reviewed and listed above, alert, oriented, appears well hydrated and in no acute distress  HEENT: atraumatic, conjunttiva clear, no obvious abnormalities on inspection of external nose and ears  NECK: no obvious masses on inspection  LUNGS: clear to auscultation bilaterally, no wheezes, rales or rhonchi, good air movement  CV: HRRR, no peripheral edema  ABD: Healing surgical scars, bowel sounds present in all 4 quadrants, abdomen is soft and without significant tenderness to palpation,  rebound or guarding  MS: moves all extremities without noticeable abnormality  PSYCH: pleasant and cooperative, no obvious depression or anxiety  ASSESSMENT AND PLAN:  Discussed the following assessment and plan:  Anxiety  Non-intractable vomiting with nausea, unspecified vomiting type  Abdominal pain, unspecified abdominal location  -Offered help with referrals for psychiatry and counseling, she seems to already have this process in place, I think her recent health events are probably ading to her anxiety, rather than becoming immune to the Zoloft -she agrees to follow-up with psychiatry -Her abdominal exam is fairly benign, but I did advise given her recent events she must contact her surgeon today and her gastroenterologist about her symptoms -she and her mother agreed to do so, given a small amount of Zofran and encouraged fluids and no dairy -Patient advised to return or notify a doctor immediately if symptoms worsen or persist or new concerns arise.  Advised of options over the weekend should her vomiting or abdominal pain recur.  Patient Instructions  Call your surgeon's/GI offices today about your symptoms.  Seek emergency care if repetitive vomiting returns or symptoms worsening.  Plenty of fluids.  No dairy.     Colin Benton R., DO

## 2017-10-10 ENCOUNTER — Ambulatory Visit
Admission: RE | Admit: 2017-10-10 | Discharge: 2017-10-10 | Disposition: A | Discharge status: Home / Self Care | Payer: 59 | Source: Ambulatory Visit | Attending: Student | Admitting: Student

## 2017-10-10 ENCOUNTER — Other Ambulatory Visit: Payer: Self-pay | Admitting: Student

## 2017-10-10 DIAGNOSIS — N200 Calculus of kidney: Secondary | ICD-10-CM | POA: Diagnosis not present

## 2017-10-10 DIAGNOSIS — R112 Nausea with vomiting, unspecified: Secondary | ICD-10-CM | POA: Diagnosis not present

## 2017-10-10 MED ORDER — IOPAMIDOL (ISOVUE-300) INJECTION 61%
100.0000 mL | Freq: Once | INTRAVENOUS | Status: AC | PRN
  Administered 2017-10-10: 100 mL via INTRAVENOUS

## 2017-10-14 ENCOUNTER — Ambulatory Visit: Payer: Self-pay | Admitting: Family Medicine

## 2017-11-02 ENCOUNTER — Other Ambulatory Visit: Payer: Self-pay | Admitting: Student

## 2017-11-02 DIAGNOSIS — R112 Nausea with vomiting, unspecified: Secondary | ICD-10-CM

## 2017-11-02 DIAGNOSIS — Z9049 Acquired absence of other specified parts of digestive tract: Secondary | ICD-10-CM | POA: Diagnosis not present

## 2017-11-03 ENCOUNTER — Ambulatory Visit
Admission: RE | Admit: 2017-11-03 | Discharge: 2017-11-03 | Disposition: A | Discharge status: Home / Self Care | Payer: 59 | Source: Ambulatory Visit | Attending: Student | Admitting: Student

## 2017-11-03 ENCOUNTER — Telehealth: Payer: Self-pay

## 2017-11-03 ENCOUNTER — Other Ambulatory Visit: Payer: Self-pay

## 2017-11-03 DIAGNOSIS — R112 Nausea with vomiting, unspecified: Secondary | ICD-10-CM | POA: Diagnosis not present

## 2017-11-03 MED ORDER — PANTOPRAZOLE SODIUM 40 MG PO TBEC: 40.0000 mg | DELAYED_RELEASE_TABLET | Freq: Every day | ORAL | 1 refills | Status: DC

## 2017-11-03 NOTE — Telephone Encounter (Signed)
Received request from [facility] stating that Ins will only cover a 90 day supply.   Prescription sent for 90 day supply.

## 2017-11-11 ENCOUNTER — Encounter (HOSPITAL_COMMUNITY): Payer: Self-pay | Admitting: Emergency Medicine

## 2017-11-11 ENCOUNTER — Observation Stay (HOSPITAL_COMMUNITY)
Admission: EM | Admit: 2017-11-11 | Discharge: 2017-11-12 | Disposition: A | Discharge status: Home / Self Care | Payer: 59 | Attending: Internal Medicine | Admitting: Internal Medicine

## 2017-11-11 ENCOUNTER — Encounter: Payer: Self-pay | Admitting: Physician Assistant

## 2017-11-11 ENCOUNTER — Emergency Department (HOSPITAL_COMMUNITY): Payer: 59

## 2017-11-11 ENCOUNTER — Ambulatory Visit (INDEPENDENT_AMBULATORY_CARE_PROVIDER_SITE_OTHER): Payer: 59 | Admitting: Physician Assistant

## 2017-11-11 VITALS — BP 120/70 | HR 84 | Temp 98.4°F | Ht 69.0 in | Wt 153.0 lb

## 2017-11-11 DIAGNOSIS — R1011 Right upper quadrant pain: Secondary | ICD-10-CM | POA: Diagnosis not present

## 2017-11-11 DIAGNOSIS — F419 Anxiety disorder, unspecified: Secondary | ICD-10-CM | POA: Insufficient documentation

## 2017-11-11 DIAGNOSIS — G4700 Insomnia, unspecified: Secondary | ICD-10-CM | POA: Insufficient documentation

## 2017-11-11 DIAGNOSIS — R109 Unspecified abdominal pain: Secondary | ICD-10-CM | POA: Diagnosis not present

## 2017-11-11 DIAGNOSIS — R1115 Cyclical vomiting syndrome unrelated to migraine: Secondary | ICD-10-CM

## 2017-11-11 DIAGNOSIS — Z23 Encounter for immunization: Secondary | ICD-10-CM | POA: Insufficient documentation

## 2017-11-11 DIAGNOSIS — R112 Nausea with vomiting, unspecified: Secondary | ICD-10-CM | POA: Diagnosis not present

## 2017-11-11 DIAGNOSIS — R197 Diarrhea, unspecified: Secondary | ICD-10-CM | POA: Diagnosis present

## 2017-11-11 DIAGNOSIS — Z9049 Acquired absence of other specified parts of digestive tract: Secondary | ICD-10-CM

## 2017-11-11 DIAGNOSIS — R1110 Vomiting, unspecified: Secondary | ICD-10-CM | POA: Diagnosis not present

## 2017-11-11 DIAGNOSIS — Z793 Long term (current) use of hormonal contraceptives: Secondary | ICD-10-CM | POA: Insufficient documentation

## 2017-11-11 DIAGNOSIS — R509 Fever, unspecified: Secondary | ICD-10-CM | POA: Diagnosis not present

## 2017-11-11 LAB — LIPASE, BLOOD: Lipase: 28 U/L (ref 11–51)

## 2017-11-11 LAB — CBC
HCT: 48.9 % — ABNORMAL HIGH (ref 36.0–46.0)
HEMOGLOBIN: 16.9 g/dL — AB (ref 12.0–15.0)
MCH: 30 pg (ref 26.0–34.0)
MCHC: 34.6 g/dL (ref 30.0–36.0)
MCV: 86.9 fL (ref 78.0–100.0)
Platelets: 277 10*3/uL (ref 150–400)
RBC: 5.63 MIL/uL — AB (ref 3.87–5.11)
RDW: 12.6 % (ref 11.5–15.5)
WBC: 8.1 10*3/uL (ref 4.0–10.5)

## 2017-11-11 LAB — COMPREHENSIVE METABOLIC PANEL
ALK PHOS: 68 U/L (ref 38–126)
ALT: 20 U/L (ref 14–54)
ANION GAP: 14 (ref 5–15)
AST: 24 U/L (ref 15–41)
Albumin: 5 g/dL (ref 3.5–5.0)
BILIRUBIN TOTAL: 1.4 mg/dL — AB (ref 0.3–1.2)
BUN: 11 mg/dL (ref 6–20)
CALCIUM: 10 mg/dL (ref 8.9–10.3)
CO2: 21 mmol/L — ABNORMAL LOW (ref 22–32)
Chloride: 108 mmol/L (ref 101–111)
Creatinine, Ser: 0.88 mg/dL (ref 0.44–1.00)
Glucose, Bld: 98 mg/dL (ref 65–99)
Potassium: 3.6 mmol/L (ref 3.5–5.1)
Sodium: 143 mmol/L (ref 135–145)
TOTAL PROTEIN: 8.2 g/dL — AB (ref 6.5–8.1)

## 2017-11-11 LAB — URINALYSIS, ROUTINE W REFLEX MICROSCOPIC
BILIRUBIN URINE: NEGATIVE
Glucose, UA: NEGATIVE mg/dL
Ketones, ur: 80 mg/dL — AB
LEUKOCYTES UA: NEGATIVE
NITRITE: NEGATIVE
PH: 5 (ref 5.0–8.0)
Protein, ur: NEGATIVE mg/dL

## 2017-11-11 LAB — I-STAT BETA HCG BLOOD, ED (MC, WL, AP ONLY)

## 2017-11-11 MED ORDER — PANTOPRAZOLE SODIUM 40 MG IV SOLR
40.0000 mg | Freq: Once | INTRAVENOUS | Status: AC
  Administered 2017-11-11: 40 mg via INTRAVENOUS
  Filled 2017-11-11: qty 40

## 2017-11-11 MED ORDER — ONDANSETRON 4 MG PO TBDP
4.0000 mg | ORAL_TABLET | Freq: Once | ORAL | Status: AC | PRN
  Administered 2017-11-11: 4 mg via ORAL
  Filled 2017-11-11: qty 1

## 2017-11-11 MED ORDER — ACETAMINOPHEN 650 MG RE SUPP: 650.0000 mg | Freq: Four times a day (QID) | RECTAL | Status: DC | PRN

## 2017-11-11 MED ORDER — IOPAMIDOL (ISOVUE-300) INJECTION 61%
INTRAVENOUS | Status: AC
  Administered 2017-11-11: 100 mL
  Filled 2017-11-11: qty 100

## 2017-11-11 MED ORDER — MORPHINE SULFATE (PF) 4 MG/ML IV SOLN
4.0000 mg | Freq: Once | INTRAVENOUS | Status: AC
  Administered 2017-11-11: 4 mg via INTRAVENOUS
  Filled 2017-11-11: qty 1

## 2017-11-11 MED ORDER — ONDANSETRON HCL 4 MG PO TABS: 4.0000 mg | ORAL_TABLET | Freq: Four times a day (QID) | ORAL | Status: DC | PRN

## 2017-11-11 MED ORDER — MORPHINE SULFATE (PF) 2 MG/ML IV SOLN: 2.0000 mg | INTRAVENOUS | Status: DC | PRN

## 2017-11-11 MED ORDER — ACETAMINOPHEN 325 MG PO TABS: 650.0000 mg | ORAL_TABLET | Freq: Four times a day (QID) | ORAL | Status: DC | PRN

## 2017-11-11 MED ORDER — METOCLOPRAMIDE HCL 5 MG/ML IJ SOLN
10.0000 mg | Freq: Once | INTRAMUSCULAR | Status: AC
  Administered 2017-11-11: 10 mg via INTRAVENOUS
  Filled 2017-11-11: qty 2

## 2017-11-11 MED ORDER — PANTOPRAZOLE SODIUM 40 MG IV SOLR
40.0000 mg | Freq: Two times a day (BID) | INTRAVENOUS | Status: DC
  Administered 2017-11-12: 40 mg via INTRAVENOUS
  Filled 2017-11-11: qty 40

## 2017-11-11 MED ORDER — SODIUM CHLORIDE 0.9 % IV BOLUS (SEPSIS)
1000.0000 mL | Freq: Once | INTRAVENOUS | Status: AC
  Administered 2017-11-11: 1000 mL via INTRAVENOUS

## 2017-11-11 MED ORDER — ENOXAPARIN SODIUM 40 MG/0.4ML ~~LOC~~ SOLN
40.0000 mg | SUBCUTANEOUS | Status: DC
  Filled 2017-11-11: qty 0.4

## 2017-11-11 MED ORDER — ALBUTEROL SULFATE (2.5 MG/3ML) 0.083% IN NEBU: 2.5000 mg | INHALATION_SOLUTION | Freq: Four times a day (QID) | RESPIRATORY_TRACT | Status: DC | PRN

## 2017-11-11 MED ORDER — SODIUM CHLORIDE 0.9 % IV SOLN
INTRAVENOUS | Status: DC
  Administered 2017-11-12: 05:00:00 via INTRAVENOUS

## 2017-11-11 MED ORDER — ONDANSETRON HCL 4 MG/2ML IJ SOLN
4.0000 mg | Freq: Once | INTRAMUSCULAR | Status: AC
  Administered 2017-11-11: 4 mg via INTRAVENOUS
  Filled 2017-11-11: qty 2

## 2017-11-11 MED ORDER — ONDANSETRON HCL 4 MG/2ML IJ SOLN
4.0000 mg | Freq: Four times a day (QID) | INTRAMUSCULAR | Status: DC | PRN
Start: 2017-11-11 — End: 2017-11-12
  Administered 2017-11-12 (×2): 4 mg via INTRAVENOUS
  Filled 2017-11-11 (×2): qty 2

## 2017-11-11 NOTE — Patient Instructions (Signed)
Go to the Emergency department at [facility].

## 2017-11-11 NOTE — ED Notes (Signed)
ED TO INPATIENT HANDOFF REPORT  Name/Age/Gender Alyssa Cunningham 22 y.o. female  Code Status    Code Status Orders  (From admission, onward)        Start     Ordered   11/11/17 2132  Full code  Continuous     11/11/17 2136    Code Status History    Date Active Date Inactive Code Status Order ID Comments User Context   09/20/2017 21:34 09/23/2017 18:42 Full Code [id number]  Coralie Keens, MD Inpatient      Home/SNF/Other Home  Chief Complaint PCP sent over for vomiting, nauesa, side pains, chills  Level of Care/Admitting Diagnosis ED Disposition    ED Disposition Condition [place]: [facility] [[id number]]  Level of Care: Med-Surg [16]  Diagnosis: Abdominal pain [009381]  Admitting Physician: Norval Morton [[id number]]  Attending Physician: Norval Morton [[id number]]  PT Class (Do Not Modify): Observation [104]  PT Acc Code (Do Not Modify): Observation [10022]       Medical History Past Medical History:  Diagnosis Date  . Frequent headaches    since age 78,  . Menorrhagia    DUB, dysmenorrhea since 2014  . Migraine    "q couple weeks" (09/21/2017)  . Osteochondroma    sees [place] [facility]  . Right knee pain    "knee cap is off track" (09/21/2017)    Allergies No Known Allergies  IV Location/Drains/Wounds Patient Lines/Drains/Airways Status   Active Line/Drains/Airways    Name:   Placement date:   Placement time:   Site:   Days:   Peripheral IV 11/11/17 Left Antecubital   11/11/17    1847    Antecubital   less than 1   Incision - 4 Ports Abdomen 1: Umbilicus 2: Mid;Upper 3: Right;Medial 4: Right;Lateral   09/22/17    -     50          Labs/Imaging Results for orders placed or performed during the hospital encounter of 11/11/17 (from the past 48 hour(s))  Lipase, blood     Status: None   Collection Time: 11/11/17  3:45 PM  Result Value Ref Range   Lipase 28 11 - 51 U/L    Comment: Performed at [facility], [place] [address] [place], [place] [address]  Comprehensive metabolic panel     Status: Abnormal   Collection Time: 11/11/17  3:45 PM  Result Value Ref Range   Sodium 143 135 - 145 mmol/L   Potassium 3.6 3.5 - 5.1 mmol/L   Chloride 108 101 - 111 mmol/L   CO2 21 (L) 22 - 32 mmol/L   Glucose, Bld 98 65 - 99 mg/dL   BUN 11 6 - 20 mg/dL   Creatinine, Ser 0.88 0.44 - 1.00 mg/dL   Calcium 10.0 8.9 - 10.3 mg/dL   Total Protein 8.2 (H) 6.5 - 8.1 g/dL   Albumin 5.0 3.5 - 5.0 g/dL   AST 24 15 - 41 U/L   ALT 20 14 - 54 U/L   Alkaline Phosphatase 68 38 - 126 U/L   Total Bilirubin 1.4 (H) 0.3 - 1.2 mg/dL   GFR calc non Af Amer >60 >60 mL/min   GFR calc Af Amer >60 >60 mL/min    Comment: (NOTE) The eGFR has been calculated using the CKD EPI equation. This calculation has not been validated in all clinical situations. eGFR's persistently <60 mL/min signify possible Chronic Kidney Disease.    Anion  gap 14 5 - 15    Comment: Performed at [facility], [place] [address], [place], [place] [address]  CBC     Status: Abnormal   Collection Time: 11/11/17  3:45 PM  Result Value Ref Range   WBC 8.1 4.0 - 10.5 K/uL   RBC 5.63 (H) 3.87 - 5.11 MIL/uL   Hemoglobin 16.9 (H) 12.0 - 15.0 g/dL   HCT 48.9 (H) 36.0 - 46.0 %   MCV 86.9 78.0 - 100.0 fL   MCH 30.0 26.0 - 34.0 pg   MCHC 34.6 30.0 - 36.0 g/dL   RDW 12.6 11.5 - 15.5 %   Platelets 277 150 - 400 K/uL    Comment: Performed at [facility], [place] [address] [place], [place] [address]  I-Stat beta hCG blood, ED     Status: None   Collection Time: 11/11/17  3:56 PM  Result Value Ref Range   I-stat hCG, quantitative <5.0 <5 mIU/mL   Comment 3            Comment:   GEST. AGE      CONC.  (mIU/mL)   <=1 WEEK        5 - 50     2 WEEKS       50 - 500     3 WEEKS       100 - 10,000     4 WEEKS     1,000 - 30,000        FEMALE AND NON-PREGNANT FEMALE:     LESS THAN 5 mIU/mL    Ct Abdomen Pelvis W  Contrast  Result Date: 11/11/2017 CLINICAL DATA:  Abdominal pain, nausea, and vomiting for the past 3 days. Cholecystectomy 2 months ago. EXAM: CT ABDOMEN AND PELVIS WITH CONTRAST TECHNIQUE: Multidetector CT imaging of the abdomen and pelvis was performed using the standard protocol following bolus administration of intravenous contrast. CONTRAST:  139m ISOVUE-300 IOPAMIDOL (ISOVUE-300) INJECTION 61% COMPARISON:  Abdominal ultrasound dated November 03, 2017. CT abdomen pelvis dated October 10, 2017. FINDINGS: Lower chest: No acute abnormality. Hepatobiliary: No focal liver abnormality. Focal fat along the falciform ligament. Status post cholecystectomy. There is a trace amount of fluid in the gallbladder fossa, unchanged when compared to prior CT. No biliary dilatation. Pancreas: Unremarkable. No pancreatic ductal dilatation or surrounding inflammatory changes. Spleen: Normal in size without focal abnormality. Adrenals/Urinary Tract: Adrenal glands are unremarkable. Unchanged punctate nonobstructive right renal calculi. No hydronephrosis. Bladder is unremarkable. Stomach/Bowel: Stomach is within normal limits. Appendix appears normal. No evidence of bowel wall thickening, distention, or inflammatory changes. Vascular/Lymphatic: No significant vascular findings are present. No enlarged abdominal or pelvic lymph nodes. Reproductive: Uterus and bilateral adnexa are unremarkable. Other: No abdominal wall hernia or abnormality. No pneumoperitoneum. Musculoskeletal: No acute or significant osseous findings. IMPRESSION: 1.  No acute intra-abdominal process. 2. Unchanged trace amount of fluid in the gallbladder fossa, favored to be postsurgical in etiology and less likely biliary leak given relative stability since 10/10/2017. 3. Punctate right nephrolithiasis, unchanged. Electronically Signed   By: WTitus DubinM.D.   On: 11/11/2017 19:44    Pending Labs Unresulted Labs (From admission, onward)   Start      Ordered   11/12/17 0500  CBC  Tomorrow morning,   R     11/11/17 2136   11/12/17 [id number] Basic metabolic panel  Tomorrow morning,   R     11/11/17 2136   11/11/17 2031  Culture, blood (routine  x 2)  BLOOD CULTURE X 2,   STAT     11/11/17 2030   11/11/17 1853  Urinalysis, Routine w reflex microscopic  STAT,   STAT     11/11/17 1852      Vitals/Pain Today's Vitals   11/11/17 1531 11/11/17 1538 11/11/17 1813 11/11/17 2106  BP: (!) 132/104  125/82 130/76  Pulse: 89  72 67  Resp: _0 Temp: 98.1 F (36.7 C)     TempSrc: Oral     SpO2: 100%  99% 100%  PainSc:  6       Isolation Precautions No active isolations  Medications Medications  sodium chloride 0.9 % bolus 1,000 mL (not administered)  enoxaparin (LOVENOX) injection 40 mg (not administered)  0.9 %  sodium chloride infusion (not administered)  acetaminophen (TYLENOL) tablet 650 mg (not administered)    Or  acetaminophen (TYLENOL) suppository 650 mg (not administered)  ondansetron (ZOFRAN) tablet 4 mg (not administered)    Or  ondansetron (ZOFRAN) injection 4 mg (not administered)  albuterol (PROVENTIL) (2.5 MG/3ML) 0.083% nebulizer solution 2.5 mg (not administered)  morphine 2 MG/ML injection 2 mg (not administered)  ondansetron (ZOFRAN-ODT) disintegrating tablet 4 mg (4 mg Oral Given 11/11/17 1545)  morphine 4 MG/ML injection 4 mg (4 mg Intravenous Given 11/11/17 1905)  ondansetron (ZOFRAN) injection 4 mg (4 mg Intravenous Given 11/11/17 1905)  sodium chloride 0.9 % bolus 1,000 mL (1,000 mLs Intravenous New Bag/Given 11/11/17 1906)  iopamidol (ISOVUE-300) 61 % injection (100 mLs  Contrast Given 11/11/17 1910)  pantoprazole (PROTONIX) injection 40 mg (40 mg Intravenous Given 11/11/17 2205)  metoCLOPramide (REGLAN) injection 10 mg (10 mg Intravenous Given 11/11/17 2205)    Mobility walks

## 2017-11-11 NOTE — ED Notes (Signed)
Attempted to call report to floor 

## 2017-11-11 NOTE — ED Notes (Signed)
Patient aware of urine specimen. Patient unable to provide urine at this time.

## 2017-11-11 NOTE — Progress Notes (Signed)
   Discussed w/ Dr. Melina Copa and based upon what I know from chart review, discussion with Dr. Melina Copa and my [facility] colleagues she does not seem to be tolerating her sxs as outpatient and would benefit from inpt eval (obs status I suppose). I will see her in AM and reassess, given persistent vomiting complaints seems like an EGD is appropriate and can can be done tomorrow.  She should be left NPO  Gatha Mayer, MD, [facility] [phone] (pager) 11/11/2017 9:32 PM

## 2017-11-11 NOTE — ED Triage Notes (Signed)
Patient here from [place] with complaints of abdominal pain, nausea, vomiting x3 days. Recent gallbladder removal. Reports that she has experience off and on n/v since surgery. Actively vomiting in triage.

## 2017-11-11 NOTE — Progress Notes (Signed)
Thank you for sending this case to me since I saw her with you while covering clinic today. I have reviewed the entire note, and the outlined plan seems appropriate.  We mutually agreed she should be hospitalized to control vomiting and for IV fluids.  Dr. Carlean Purl will see in consult and determine if EGD would be appropriate.  Perhaps there was an underlying UGI condition other than gallstones all along (gastritis, gastric dysrhythmia).  Post-op fluid collection of unclear significance, but repeat US warranted to make sure it is not enlarging further.  Blood cultures.  Wilfrid Lund, MD

## 2017-11-11 NOTE — Progress Notes (Signed)
Subjective:    Patient ID: Alyssa Cunningham, female    DOB: 22-May-1996, 22 y.o.   MRN: [id number]  HPI  Alyssa Cunningham is a pleasant 22 year old white female, who is seen once previously in our office in November 2018 and established by Alyssa Cunningham. At that time she came in with complaints of nausea vomiting and right-sided abdominal pain. At that time she's been vomiting about once a week.. She had had a previous CT scan which was negative. [facility] HIDA scan was done, and she was started on Protonix. [facility]CK HIDA scan came back with a low ejection fraction of 22%. She was referred to surgery, was seen by Alyssa Cunningham and admitted on 09/20/2017, after she presented with worsening abdominal pain nausea and vomiting. She underwent laparoscopic cholecystectomy on 12/27. With diagnosis of a calculus cholecystitis. Patient states that she did well for about a week after the cholecystectomy and then started having problems again with nausea vomiting and abdominal pain. 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 She had repeat CT imaging on 10/10/2017 per surgery showing her to be postcholecystectomy, there is a small 3.3 x 1.3 x 0.6 cm slightly complex fluid collection in the gallbladder fossa and a 1 x 1 x 1 cm slightly complex fluid collection in the left lobe of the liver.. Patient has continued to feel ill. He says she has lost at least 20 pounds since surgery, and has several days a week where she is nauseated 24 hours a day, vomiting off and on and unable to keep down any by mouth's. She complains of exhaustion. She has had chills but no documented fever. She was seen again by surgery on 11/03/2017 had abdominal ultrasound ordered which showed this fluid collection to be slightly larger at 5.5 x 0.7 x 0.4 cm, rule out infected postsurgical collection or bile leak. I don't have any recent labs. She says she's lost another 3 pounds over the past week, has not eaten anything over the past 2 days. She feels weak  and continues to have right-sided constant abdominal pain. She was hemodynamically stable in our office today but actively vomiting, appears dry.   Review of Systems Pertinent positive and negative review of systems were noted in the above HPI section.  All other review of systems was otherwise negative.  Outpatient Encounter Medications as of 11/11/2017  Medication Sig  . acetaminophen 325 MG tablet You can take 2 tablets every 4 hours as needed for pain.  You can alternate this with ibuprofen or the oxycodone.  Marland Kitchen ibuprofen (ADVIL,MOTRIN) 200 MG tablet You can take 2-3 tablets every 6 hours as needed for pain.  You can alternate this with plain Tylenol or oxycodone.  . Norethindrone Acetate-Ethinyl Estradiol (JUNEL 1.5/30) 1.5-30 MG-MCG tablet Take 1 tablet by mouth daily.  . ondansetron (ZOFRAN ODT) 4 MG disintegrating tablet Take 1 tablet (4 mg total) by mouth every 8 (eight) hours as needed for nausea or vomiting.  . pantoprazole (PROTONIX) 40 MG tablet Take 1 tablet (40 mg total) by mouth daily.  . sertraline (ZOLOFT) 100 MG tablet Take 1 tablet (100 mg total) by mouth daily.  . [DISCONTINUED] oxyCODONE (OXY IR/ROXICODONE) 5 MG immediate release tablet Take 1-2 tablets (5-10 mg total) by mouth every 4 (four) hours as needed for moderate pain.   No facility-administered encounter medications on file as of 11/11/2017.    No Known Allergies Patient Active Problem List   Diagnosis Date Noted  . Cholecystitis 09/20/2017  .  Right upper quadrant pain 08/04/2017  . Non-intractable vomiting with nausea 08/04/2017  . Gastroesophageal reflux disease without esophagitis 08/04/2017  . Dysfunctional uterine bleeding 03/11/2017  . Dysmenorrhea 02/20/2015  . Frequent headaches 02/20/2015  . Osteochondroma 01/05/2015  . Menometrorrhagia 10/08/2013   Social History   Socioeconomic History  . Marital status: Single    Spouse name: Not on file  . Number of children: 0  . Years of education: Not on  file  . Highest education level: Not on file  Social Needs  . Financial resource strain: Not on file  . Food insecurity - worry: Not on file  . Food insecurity - inability: Not on file  . Transportation needs - medical: Not on file  . Transportation needs - non-medical: Not on file  Occupational History  . Occupation: Ship broker  Tobacco Use  . Smoking status: Never Smoker  . Smokeless tobacco: Never Used  Substance and Sexual Activity  . Alcohol use: No  . Drug use: No  . Sexual activity: No  Other Topics Concern  . Not on file  Social History Narrative   Work or School: Going to school in June      Home Situation:       Spiritual Beliefs: Christian      Lifestyle: no regular exercise; diet is healthy       Alyssa Cunningham She was adopted. Family history is unknown by patient.      Objective:    Vitals:   11/11/17 1400  BP: 120/70  Pulse: 84  Temp: 98.4 F (36.9 C)    Physical Exam; well-developed ill appearing young white female in no acute distress, had just vomited into trash can, accompanied by her brother blood pressure 120/70 pulse in the 80s height 5 foot 9, weight 153 BMI 22.5 temp 98 4. HEENT; nontraumatic normocephalic EOMI PERRLA sclera anicteric, buccal mucosa somewhat dry, Cardiovascular ;regular rate and rhythm with S1-S2 no murmur or gallop, Pulmonary; clear bilaterally, Abdomen; soft, bowel sounds are present she is quite tender in the right upper quadrant there is no guarding or rebound no mass or hepatosplenomegaly bowel sounds are present no succussion splash, Rectal ;exam not done, Extremities ;no clubbing cyanosis or edema skin warm and dry, Neuropsych; mood and affect appropriate       Assessment & Cunningham:   #56 22 year old white female with persistent right-sided abdominal pain nausea vomiting fatigue and significant weight loss of 20 pounds status post cholecystectomy on 09/22/2017. Patient has a small fluid collection documented on CT scan on  10/10/2017 and again on ultrasound 11/03/2017 - the ultrasound showed the collection to be a bit larger at 5 cm x 1 cm. It is unclear whether this fluid collection represents an abscess, or whether she had a bile leak that sealed, however with her persistent symptoms need to consider infected collection. This patient also had similar complaints prior to her cholecystectomy which prompted the workup. She may have other underlying GI issues, gastroparesis, peptic ulcer disease. #2 anxiety #3 GERD  Cunningham; discussed with Dr. Loletha Carrow who also evaluated patient in the office today. Patient needs hospitalization for symptom control, IV fluid hydration, repeat labs, blood cultures and would repeat imaging with upper abdominal ultrasound to reassess the gallbladder fossa fluid collection. She may need IR consultation for drainage. I contacted the [facility] hospitalist for direct admission to the hospitalist service, however they felt she would be better served with ER care prior to admission. GI will see her in consultation. If the  fluid collection is not felt to be the source of her current symptoms and/or symptoms don't improve with drainage she will need further GI evaluation with upper endoscopy. Would also start her on IV PPI twice daily.  Rachael Ferrie S Jahaziel Francois PA-C 11/11/2017   Cc: [name], DO

## 2017-11-11 NOTE — Progress Notes (Signed)
Reviewed and agree with management plan.  Consuello Lassalle T. Inice Sanluis, MD FACG 

## 2017-11-11 NOTE — H&P (Signed)
History and Physical    Alyssa Cunningham ZDG:[id number] DOB: [birth year] DOA: 11/11/2017  Referring MD/NP/PA: Dr. Aletta Edouard PCP: [name], DO  Patient coming from: Home  Chief Complaint:  Abdominal pain, nausea, vomiting  I have personally briefly reviewed patient's old medical records in [place]   HPI: Alyssa Cunningham is a 22 y.o. female with medical history significant of anxiety and s/p cholecystectomy 08/2017; who presents with complaints of right-sided abdominal pain, nausea, vomiting, and diarrhea since being discharged from the hospital on 09/23/2017.  Surgery was performed by Dr. Dema Severin and patient had follow-up visits with them concerning her symptoms.  She has had repeat CT scan of the abdomen and ultrasound, but no clear cause of symptoms has been found.  Given Zofran without relief of symptoms.  Denies having any blood in emesis or stools.  Over the last 3 days patient reports being unable to keep any food or liquids down.  Has not had any redness or erythema around surgical sites.  She was seen by gastroenterology today and due to her symptoms recommended to come in for further evaluation.  ED Course: Upon admission to the emergency department vital signs are noted to be stable.  Labs revealed WBC 8.1, hemoglobin 6 BUN 11, and creatinine 0.88.  CT scan of the abdomen showed no acute process and continued trace fluid around gallbladder fossa unchanged less likely to be a biliary leak.  Patient was given IV fluids, Zofran, Reglan, and morphine for pain.  Gastroenterology was consulted and recommending admission for possible need of procedure in a.m.  [facility] called to admit  Review of Systems  Constitutional: Positive for malaise/fatigue. Negative for chills and fever.  HENT: Negative for congestion and ear discharge.   Eyes: Negative for photophobia and pain.  Respiratory: Negative for cough and sputum production.   Cardiovascular: Negative for chest pain and leg swelling.    Gastrointestinal: Positive for abdominal pain, diarrhea, nausea and vomiting. Negative for blood in stool.  Genitourinary: Positive for flank pain. Negative for urgency.  Musculoskeletal: Negative for joint pain and myalgias.  Skin: Negative for itching and rash.  Neurological: Positive for weakness. Negative for speech change and focal weakness.  Psychiatric/Behavioral: Negative for substance abuse. The patient is not nervous/anxious.     Past Medical History:  Diagnosis Date  . Frequent headaches    since age 35,  . Menorrhagia    DUB, dysmenorrhea since 2014  . Migraine    "q couple weeks" (09/21/2017)  . Osteochondroma    sees [place] [facility]  . Right knee pain    "knee cap is off track" (09/21/2017)    Past Surgical History:  Procedure Laterality Date  . CHOLECYSTECTOMY N/A 09/22/2017   Procedure: LAPAROSCOPIC CHOLECYSTECTOMY;  Surgeon: Ileana Roup, MD;  Location: [facility];  Service: General;  Laterality: N/A;  . WISDOM TOOTH EXTRACTION  2015     reports that  has never smoked. she has never used smokeless tobacco. She reports that she does not drink alcohol or use drugs.  No Known Allergies  Family History  Adopted: Yes  Family history unknown: Yes    Prior to Admission medications   Medication Sig Start Date End Date Taking? Authorizing Provider  Norethindrone Acetate-Ethinyl Estradiol (JUNEL 1.5/30) 1.5-30 MG-MCG tablet Take 1 tablet by mouth daily. 05/16/17  Yes Martinique, Betty G, MD  ondansetron (ZOFRAN) 4 MG tablet Take 4 mg by mouth every 8 (eight) hours as needed for nausea or vomiting.  11/02/17  Yes [provider]  pantoprazole (PROTONIX) 40 MG tablet Take 1 tablet (40 mg total) by mouth daily. 11/03/17  Yes Zehr, Laban Emperor, PA-C  sertraline (ZOLOFT) 100 MG tablet Take 1 tablet (100 mg total) by mouth daily. 05/27/17  Yes Colin Benton R, DO  acetaminophen 325 MG tablet You can take 2 tablets every 4 hours as needed for pain.  You can alternate this with  ibuprofen or the oxycodone. Patient not taking: Reported on 11/11/2017 09/23/17   Earnstine Regal, PA-C  ibuprofen (ADVIL,MOTRIN) 200 MG tablet You can take 2-3 tablets every 6 hours as needed for pain.  You can alternate this with plain Tylenol or oxycodone. Patient not taking: Reported on 11/11/2017 09/23/17   Earnstine Regal, PA-C  ondansetron [facility] ODT) 4 MG disintegrating tablet Take 1 tablet (4 mg total) by mouth every 8 (eight) hours as needed for nausea or vomiting. Patient not taking: Reported on 11/11/2017 10/07/17   [name], DO    Physical Exam:  Constitutional: Sick appearing female who is able to follow commands at this time. Vitals:   11/11/17 1531 11/11/17 1813 11/11/17 2106  BP: (!) 132/104 125/82 130/76  Pulse: 89 72 67  Resp: 20 20 20   Temp: 98.1 F (36.7 C)    TempSrc: Oral    SpO2: 100% 99% 100%   Eyes: PERRL, lids and conjunctivae normal ENMT: Mucous membranes are dry. Posterior pharynx clear of any exudate or lesions.Normal dentition.  Neck: normal, supple, no masses, no thyromegaly Respiratory: clear to auscultation bilaterally, no wheezing, no crackles. Normal respiratory effort. No accessory muscle use.  Cardiovascular: Regular rate and rhythm, no murmurs / rubs / gallops. No extremity edema. 2+ pedal pulses. No carotid bruits.  Abdomen: tenderness to palpation over her right quadrant, no masses palpated. No hepatosplenomegaly. Bowel sounds positive.  Musculoskeletal: no clubbing / cyanosis. No joint deformity upper and lower extremities. Good ROM, no contractures. Normal muscle tone.  Skin: Poor skin turgor no rashes, lesions, ulcers. No induration Neurologic: CN 2-12 grossly intact. Sensation intact, DTR normal. Strength 5/5 in all 4.  Psychiatric: Normal judgment and insight. Alert and oriented x 3. Normal mood.     Labs on Admission: I have personally reviewed following labs and imaging studies  CBC: Recent Labs  Lab 11/11/17 1545  WBC 8.1    HGB 16.9*  HCT 48.9*  MCV 86.9  PLT 557   Basic Metabolic Panel: Recent Labs  Lab 11/11/17 1545  NA 143  K 3.6  CL 108  CO2 21*  GLUCOSE 98  BUN 11  CREATININE 0.88  CALCIUM 10.0   GFR: Estimated Creatinine Clearance: 104.8 mL/min (by C-G formula based on SCr of 0.88 mg/dL). Liver Function Tests: Recent Labs  Lab 11/11/17 1545  AST 24  ALT 20  ALKPHOS 68  BILITOT 1.4*  PROT 8.2*  ALBUMIN 5.0   Recent Labs  Lab 11/11/17 1545  LIPASE 28   No results for input(s): AMMONIA in the last 168 hours. Coagulation Profile: No results for input(s): INR, PROTIME in the last 168 hours. Cardiac Enzymes: No results for input(s): CKTOTAL, CKMB, CKMBINDEX, TROPONINI in the last 168 hours. BNP (last 3 results) No results for input(s): PROBNP in the last 8760 hours. HbA1C: No results for input(s): HGBA1C in the last 72 hours. CBG: No results for input(s): GLUCAP in the last 168 hours. Lipid Profile: No results for input(s): CHOL, HDL, LDLCALC, TRIG, CHOLHDL, LDLDIRECT in the last 72 hours. Thyroid Function Tests: No results  for input(s): TSH, T4TOTAL, FREET4, T3FREE, THYROIDAB in the last 72 hours. Anemia Panel: No results for input(s): VITAMINB12, FOLATE, FERRITIN, TIBC, IRON, RETICCTPCT in the last 72 hours. Urine analysis:    Component Value Date/Time   COLORURINE AMBER (A) 09/20/2017 1604   APPEARANCEUR HAZY (A) 09/20/2017 1604   LABSPEC 1.032 (H) 09/20/2017 1604   PHURINE 5.0 09/20/2017 1604   GLUCOSEU 50 (A) 09/20/2017 1604   HGBUR NEGATIVE 09/20/2017 [place] 09/20/2017 1604   KETONESUR 80 (A) 09/20/2017 1604   PROTEINUR 100 (A) 09/20/2017 1604   NITRITE NEGATIVE 09/20/2017 1604   LEUKOCYTESUR TRACE (A) 09/20/2017 1604   Sepsis Labs: No results found for this or any previous visit (from the past 240 hour(s)).   Radiological Exams on Admission: Ct Abdomen Pelvis W Contrast  Result Date: 11/11/2017 CLINICAL DATA:  Abdominal pain, nausea,  and vomiting for the past 3 days. Cholecystectomy 2 months ago. EXAM: CT ABDOMEN AND PELVIS WITH CONTRAST TECHNIQUE: Multidetector CT imaging of the abdomen and pelvis was performed using the standard protocol following bolus administration of intravenous contrast. CONTRAST:  179mL ISOVUE-300 IOPAMIDOL (ISOVUE-300) INJECTION 61% COMPARISON:  Abdominal ultrasound dated November 03, 2017. CT abdomen pelvis dated October 10, 2017. FINDINGS: Lower chest: No acute abnormality. Hepatobiliary: No focal liver abnormality. Focal fat along the falciform ligament. Status post cholecystectomy. There is a trace amount of fluid in the gallbladder fossa, unchanged when compared to prior CT. No biliary dilatation. Pancreas: Unremarkable. No pancreatic ductal dilatation or surrounding inflammatory changes. Spleen: Normal in size without focal abnormality. Adrenals/Urinary Tract: Adrenal glands are unremarkable. Unchanged punctate nonobstructive right renal calculi. No hydronephrosis. Bladder is unremarkable. Stomach/Bowel: Stomach is within normal limits. Appendix appears normal. No evidence of bowel wall thickening, distention, or inflammatory changes. Vascular/Lymphatic: No significant vascular findings are present. No enlarged abdominal or pelvic lymph nodes. Reproductive: Uterus and bilateral adnexa are unremarkable. Other: No abdominal wall hernia or abnormality. No pneumoperitoneum. Musculoskeletal: No acute or significant osseous findings. IMPRESSION: 1.  No acute intra-abdominal process. 2. Unchanged trace amount of fluid in the gallbladder fossa, favored to be postsurgical in etiology and less likely biliary leak given relative stability since 10/10/2017. 3. Punctate right nephrolithiasis, unchanged. Electronically Signed   By: Titus Dubin M.D.   On: 11/11/2017 19:44     Assessment/Plan Abdominal pain, nausea, vomiting, diarrhea, s/p cholecystectomy: Subacute.  Patient with symptoms over the last month of nausea,  vomiting, abdominal pain, and diarrhea.  CT scan showing no acute abdominal process and unchanged trace amount of fluid in the gallbladder fossa.  Dr. Carlean Purl of GI consulted, and reports will see patient in a.m. and evaluate for need of further intervention. - Admit for observation - NPO  - Monitor intake and output - Follow-up blood cultures - Normal saline IV fluids 125ml /hr overnight - Monitor intake and output - Antiemetics as needed - Pain control with IV morphine - Dr. Carlean Purl of general surgery consulted recommending admission and will see in for possible need of EGD    DVT prophylaxis: lovenox Code Status: full  Family Communication:  none Disposition Plan: TBD Consults called:  GI Admission status: Inpatient   Norval Morton MD [facility] Pager [phone]   If 7PM-7AM, please contact night-coverage [email or web address] Password [facility]  11/11/2017, 9:18 PM

## 2017-11-11 NOTE — ED Provider Notes (Signed)
[facility] DEPT Provider Note   CSN: [id number] Arrival date & time: 11/11/17  1516     History   Chief Complaint Chief Complaint  Patient presents with  . Abdominal Pain  . Nausea  . Emesis    HPI Alyssa Cunningham is a 22 y.o. female.  The history is provided by the patient and a relative.  Abdominal Pain   This is a new problem. The current episode started more than 1 week ago. The problem occurs constantly. The problem has not changed since onset.The pain is associated with eating. The pain is located in the RUQ and RLQ. The pain is at a severity of 10/10. Associated symptoms include anorexia, fever, diarrhea, nausea and vomiting. Pertinent negatives include melena, dysuria, frequency, hematuria and arthralgias. The symptoms are aggravated by eating. Nothing relieves the symptoms. Past workup includes GI consult, CT scan and surgery.  Emesis   Associated symptoms include abdominal pain, diarrhea and a fever. Pertinent negatives include no arthralgias, no chills and no cough.    22 year old female with status post Coley late December who has been troubled by ongoing abdominal pain nausea vomiting since then.  She states her surgeon was Dr. Dema Severin.  On review of the notes since the surgery she has had documented fluid collection in the GB fossa. today she was at the GI clinic and they sent her here for admission due to her ongoing symptoms.  She is complaining of severe crampy abdominal pain with associated nausea some diarrhea.  Past Medical History:  Diagnosis Date  . Frequent headaches    since age 67,  . Menorrhagia    DUB, dysmenorrhea since 2014  . Migraine    "q couple weeks" (09/21/2017)  . Osteochondroma    sees [place] [facility]  . Right knee pain    "knee cap is off track" (09/21/2017)    Patient Active Problem List   Diagnosis Date Noted  . Cholecystitis 09/20/2017  . Right upper quadrant pain 08/04/2017  . Non-intractable vomiting with  nausea 08/04/2017  . Gastroesophageal reflux disease without esophagitis 08/04/2017  . Dysfunctional uterine bleeding 03/11/2017  . Dysmenorrhea 02/20/2015  . Frequent headaches 02/20/2015  . Osteochondroma 01/05/2015  . Menometrorrhagia 10/08/2013    Past Surgical History:  Procedure Laterality Date  . CHOLECYSTECTOMY N/A 09/22/2017   Procedure: LAPAROSCOPIC CHOLECYSTECTOMY;  Surgeon: Ileana Roup, MD;  Location: [facility];  Service: General;  Laterality: N/A;  . WISDOM TOOTH EXTRACTION  2015    OB History    No data available       Home Medications    Prior to Admission medications   Medication Sig Start Date End Date Taking? Authorizing Provider  Norethindrone Acetate-Ethinyl Estradiol (JUNEL 1.5/30) 1.5-30 MG-MCG tablet Take 1 tablet by mouth daily. 05/16/17  Yes Martinique, Betty G, MD  ondansetron (ZOFRAN) 4 MG tablet Take 4 mg by mouth every 8 (eight) hours as needed for nausea or vomiting.  11/02/17  Yes [provider]  pantoprazole (PROTONIX) 40 MG tablet Take 1 tablet (40 mg total) by mouth daily. 11/03/17  Yes Zehr, Laban Emperor, PA-C  sertraline (ZOLOFT) 100 MG tablet Take 1 tablet (100 mg total) by mouth daily. 05/27/17  Yes Colin Benton R, DO  acetaminophen 325 MG tablet You can take 2 tablets every 4 hours as needed for pain.  You can alternate this with ibuprofen or the oxycodone. Patient not taking: Reported on 11/11/2017 09/23/17   Earnstine Regal, PA-C  ibuprofen (ADVIL,MOTRIN) 200 MG  tablet You can take 2-3 tablets every 6 hours as needed for pain.  You can alternate this with plain Tylenol or oxycodone. Patient not taking: Reported on 11/11/2017 09/23/17   Earnstine Regal, PA-C  ondansetron [facility] ODT) 4 MG disintegrating tablet Take 1 tablet (4 mg total) by mouth every 8 (eight) hours as needed for nausea or vomiting. Patient not taking: Reported on 11/11/2017 10/07/17   [name], DO    Family History Family History  Adopted: Yes  Family history  unknown: Yes    Social History Social History   Tobacco Use  . Smoking status: Never Smoker  . Smokeless tobacco: Never Used  Substance Use Topics  . Alcohol use: No  . Drug use: No     Allergies   Patient has no known allergies.   Review of Systems Review of Systems  Constitutional: Positive for fever. Negative for chills.  HENT: Negative for ear pain and sore throat.   Eyes: Negative for pain and visual disturbance.  Respiratory: Negative for cough and shortness of breath.   Cardiovascular: Negative for chest pain and palpitations.  Gastrointestinal: Positive for abdominal pain, anorexia, diarrhea, nausea and vomiting. Negative for melena.  Genitourinary: Negative for dysuria, frequency and hematuria.  Musculoskeletal: Negative for arthralgias and back pain.  Skin: Negative for color change and rash.  Neurological: Negative for seizures and syncope.  All other systems reviewed and are negative.    Physical Exam Updated Vital Signs BP 125/82 (BP Location: Left Arm)   Pulse 72   Temp 98.1 F (36.7 C) (Oral)   Resp 20   SpO2 99%   Physical Exam  Constitutional: She appears well-developed and well-nourished. No distress.  HENT:  Head: Normocephalic and atraumatic.  Eyes: Conjunctivae are normal.  Neck: Neck supple.  Cardiovascular: Normal rate and regular rhythm.  No murmur heard. Pulmonary/Chest: Effort normal and breath sounds normal. No respiratory distress.  Abdominal: Soft. There is generalized tenderness. There is no rebound and no guarding.  Musculoskeletal: She exhibits no edema, tenderness or deformity.  Neurological: She is alert.  Skin: Skin is warm and dry. Capillary refill takes less than 2 seconds.  Psychiatric: She has a normal mood and affect.  Nursing note and vitals reviewed.    ED Treatments / Results  Labs (all labs ordered are listed, but only abnormal results are displayed) Labs Reviewed  COMPREHENSIVE METABOLIC PANEL - Abnormal;  Notable for the following components:      Result Value   CO2 21 (*)    Total Protein 8.2 (*)    Total Bilirubin 1.4 (*)    All other components within normal limits  CBC - Abnormal; Notable for the following components:   RBC 5.63 (*)    Hemoglobin 16.9 (*)    HCT 48.9 (*)    All other components within normal limits  LIPASE, BLOOD  URINALYSIS, ROUTINE W REFLEX MICROSCOPIC  I-STAT BETA HCG BLOOD, ED ([facility], [facility], [facility] ONLY)  I-STAT BETA HCG BLOOD, ED ([facility], [facility], [facility] ONLY)    EKG  EKG Interpretation None       Radiology Ct Abdomen Pelvis W Contrast  Result Date: 11/11/2017 CLINICAL DATA:  Abdominal pain, nausea, and vomiting for the past 3 days. Cholecystectomy 2 months ago. EXAM: CT ABDOMEN AND PELVIS WITH CONTRAST TECHNIQUE: Multidetector CT imaging of the abdomen and pelvis was performed using the standard protocol following bolus administration of intravenous contrast. CONTRAST:  182mL ISOVUE-300 IOPAMIDOL (ISOVUE-300) INJECTION 61% COMPARISON:  Abdominal ultrasound dated  November 03, 2017. CT abdomen pelvis dated October 10, 2017. FINDINGS: Lower chest: No acute abnormality. Hepatobiliary: No focal liver abnormality. Focal fat along the falciform ligament. Status post cholecystectomy. There is a trace amount of fluid in the gallbladder fossa, unchanged when compared to prior CT. No biliary dilatation. Pancreas: Unremarkable. No pancreatic ductal dilatation or surrounding inflammatory changes. Spleen: Normal in size without focal abnormality. Adrenals/Urinary Tract: Adrenal glands are unremarkable. Unchanged punctate nonobstructive right renal calculi. No hydronephrosis. Bladder is unremarkable. Stomach/Bowel: Stomach is within normal limits. Appendix appears normal. No evidence of bowel wall thickening, distention, or inflammatory changes. Vascular/Lymphatic: No significant vascular findings are present. No enlarged abdominal or pelvic lymph nodes. Reproductive: Uterus and bilateral adnexa are  unremarkable. Other: No abdominal wall hernia or abnormality. No pneumoperitoneum. Musculoskeletal: No acute or significant osseous findings. IMPRESSION: 1.  No acute intra-abdominal process. 2. Unchanged trace amount of fluid in the gallbladder fossa, favored to be postsurgical in etiology and less likely biliary leak given relative stability since 10/10/2017. 3. Punctate right nephrolithiasis, unchanged. Electronically Signed   By: Titus Dubin M.D.   On: 11/11/2017 19:44    Procedures Procedures (including critical care time)  Medications Ordered in ED Medications  morphine 4 MG/ML injection 4 mg (not administered)  ondansetron (ZOFRAN) injection 4 mg (not administered)  sodium chloride 0.9 % bolus 1,000 mL (not administered)  ondansetron (ZOFRAN-ODT) disintegrating tablet 4 mg (4 mg Oral Given 11/11/17 1545)     Initial Impression / Assessment and Plan / ED Course  I have reviewed the triage vital signs and the nursing notes.  Pertinent labs & imaging results that were available during my care of the patient were reviewed by me and considered in my medical decision making (see chart for details).  Clinical Course as of Nov 12 1216  Fri Nov 11, 2017  2014 CT and lab work are unremarkable.  She had a stable fluid collection in the gallbladder fossa.  I am calling the hospitalist to discuss an admission.  [MB]  2134 Dr. Tamala Julian asked me to talk to GI on-call to see if the patient is going to require an admission.  I discussed with the by her GI physician Dr. Christophe Louis who states he had talked to his partner and they felt it was in her best interest for admission for further workup.  Dr. Tamala Julian is accepting the patient for admission.  [MB]    Clinical Course User Index [MB] Hayden Rasmussen, MD      Final Clinical Impressions(s) / ED Diagnoses   Final diagnoses:  RUQ abdominal pain  Non-intractable vomiting with nausea, unspecified vomiting type    ED Discharge Orders    None         Hayden Rasmussen, MD 11/12/17 1220

## 2017-11-12 ENCOUNTER — Encounter (HOSPITAL_COMMUNITY)
Admission: EM | Disposition: A | Discharge status: Home / Self Care | Payer: Self-pay | Source: Home / Self Care | Attending: Emergency Medicine

## 2017-11-12 ENCOUNTER — Encounter (HOSPITAL_COMMUNITY): Payer: Self-pay | Admitting: *Deleted

## 2017-11-12 DIAGNOSIS — K228 Other specified diseases of esophagus: Secondary | ICD-10-CM | POA: Diagnosis not present

## 2017-11-12 DIAGNOSIS — Z9049 Acquired absence of other specified parts of digestive tract: Secondary | ICD-10-CM

## 2017-11-12 DIAGNOSIS — R110 Nausea: Secondary | ICD-10-CM

## 2017-11-12 DIAGNOSIS — R1010 Upper abdominal pain, unspecified: Secondary | ICD-10-CM

## 2017-11-12 DIAGNOSIS — R197 Diarrhea, unspecified: Secondary | ICD-10-CM | POA: Diagnosis present

## 2017-11-12 DIAGNOSIS — R1115 Cyclical vomiting syndrome unrelated to migraine: Secondary | ICD-10-CM

## 2017-11-12 DIAGNOSIS — R112 Nausea with vomiting, unspecified: Secondary | ICD-10-CM | POA: Diagnosis not present

## 2017-11-12 DIAGNOSIS — R1011 Right upper quadrant pain: Secondary | ICD-10-CM

## 2017-11-12 HISTORY — PX: ESOPHAGOGASTRODUODENOSCOPY: SHX5428

## 2017-11-12 LAB — HCG, QUANTITATIVE, PREGNANCY: hCG, Beta Chain, Quant, S: <1 m[IU]/mL (ref <5)

## 2017-11-12 LAB — BASIC METABOLIC PANEL
Anion gap: 12 (ref 5–15)
BUN: 9 mg/dL (ref 6–20)
CO2: 18 mmol/L — ABNORMAL LOW (ref 22–32)
Calcium: 9 mg/dL (ref 8.9–10.3)
Chloride: 109 mmol/L (ref 101–111)
Creatinine, Ser: 0.74 mg/dL (ref 0.44–1.00)
GFR calc Af Amer: >60 mL/min (ref >60)
GLUCOSE: 105 mg/dL — AB (ref 65–99)
Potassium: 3.9 mmol/L (ref 3.5–5.1)
Sodium: 139 mmol/L (ref 135–145)

## 2017-11-12 LAB — CBC
HCT: 38.7 % (ref 36.0–46.0)
Hemoglobin: 13 g/dL (ref 12.0–15.0)
MCH: 29.4 pg (ref 26.0–34.0)
MCHC: 33.6 g/dL (ref 30.0–36.0)
MCV: 87.6 fL (ref 78.0–100.0)
Platelets: 227 10*3/uL (ref 150–400)
RBC: 4.42 MIL/uL (ref 3.87–5.11)
RDW: 12.9 % (ref 11.5–15.5)
WBC: 7.8 10*3/uL (ref 4.0–10.5)

## 2017-11-12 LAB — HEPATIC FUNCTION PANEL
ALT: 12 U/L — AB (ref 14–54)
AST: 17 U/L (ref 15–41)
Albumin: 3.7 g/dL (ref 3.5–5.0)
Alkaline Phosphatase: 49 U/L (ref 38–126)
BILIRUBIN DIRECT: 0.2 mg/dL (ref 0.1–0.5)
BILIRUBIN TOTAL: 1.1 mg/dL (ref 0.3–1.2)
Indirect Bilirubin: 0.9 mg/dL (ref 0.3–0.9)
Total Protein: 6.5 g/dL (ref 6.5–8.1)

## 2017-11-12 SURGERY — EGD (ESOPHAGOGASTRODUODENOSCOPY)
Anesthesia: Moderate Sedation

## 2017-11-12 MED ORDER — MORPHINE SULFATE (PF) 4 MG/ML IV SOLN
2.0000 mg | INTRAVENOUS | Status: DC | PRN
  Administered 2017-11-12: 2 mg via INTRAVENOUS
  Filled 2017-11-12: qty 1

## 2017-11-12 MED ORDER — FENTANYL CITRATE (PF) 100 MCG/2ML IJ SOLN
INTRAMUSCULAR | Status: DC | PRN
  Administered 2017-11-12 (×3): 25 ug via INTRAVENOUS

## 2017-11-12 MED ORDER — MIDAZOLAM HCL 10 MG/2ML IJ SOLN
INTRAMUSCULAR | Status: DC | PRN
  Administered 2017-11-12: 2 mg via INTRAVENOUS
  Administered 2017-11-12 (×2): 1 mg via INTRAVENOUS
  Administered 2017-11-12 (×2): 2 mg via INTRAVENOUS
  Administered 2017-11-12: 1 mg via INTRAVENOUS

## 2017-11-12 MED ORDER — MIRTAZAPINE 15 MG PO TBDP: 15.0000 mg | ORAL_TABLET | Freq: Every day | ORAL | 2 refills | Status: AC

## 2017-11-12 MED ORDER — MIDAZOLAM HCL 5 MG/ML IJ SOLN
INTRAMUSCULAR | Status: AC
  Filled 2017-11-12: qty 2

## 2017-11-12 MED ORDER — PROMETHAZINE HCL 25 MG/ML IJ SOLN
12.5000 mg | Freq: Four times a day (QID) | INTRAMUSCULAR | Status: DC | PRN
  Administered 2017-11-12: 12.5 mg via INTRAVENOUS
  Filled 2017-11-12: qty 1

## 2017-11-12 MED ORDER — SODIUM CHLORIDE 0.9 % IV SOLN
INTRAVENOUS | Status: DC
  Administered 2017-11-12: 500 mL via INTRAVENOUS

## 2017-11-12 MED ORDER — BUTAMBEN-TETRACAINE-BENZOCAINE 2-2-14 % EX AERO
INHALATION_SPRAY | CUTANEOUS | Status: DC | PRN
  Administered 2017-11-12: 2 via TOPICAL

## 2017-11-12 MED ORDER — INFLUENZA VAC SPLIT QUAD 0.5 ML IM SUSY
0.5000 mL | PREFILLED_SYRINGE | INTRAMUSCULAR | Status: AC
  Administered 2017-11-12: 0.5 mL via INTRAMUSCULAR
  Filled 2017-11-12: qty 0.5

## 2017-11-12 MED ORDER — FENTANYL CITRATE (PF) 100 MCG/2ML IJ SOLN
INTRAMUSCULAR | Status: AC
Start: 2017-11-12 — End: 2017-11-12
  Filled 2017-11-12: qty 2

## 2017-11-12 MED ORDER — DIPHENHYDRAMINE HCL 50 MG/ML IJ SOLN
INTRAMUSCULAR | Status: AC
  Filled 2017-11-12: qty 1

## 2017-11-12 MED ORDER — KETOROLAC TROMETHAMINE 30 MG/ML IJ SOLN
30.0000 mg | Freq: Four times a day (QID) | INTRAMUSCULAR | Status: DC
  Administered 2017-11-12: 30 mg via INTRAVENOUS
  Filled 2017-11-12: qty 1

## 2017-11-12 MED ORDER — PANTOPRAZOLE SODIUM 40 MG PO TBEC: 40.0000 mg | DELAYED_RELEASE_TABLET | Freq: Every day | ORAL | 0 refills | Status: DC

## 2017-11-12 MED ORDER — DIPHENHYDRAMINE HCL 50 MG/ML IJ SOLN
INTRAMUSCULAR | Status: DC | PRN
  Administered 2017-11-12: 25 mg via INTRAVENOUS

## 2017-11-12 MED ORDER — ONDANSETRON 4 MG PO TBDP: 4.0000 mg | ORAL_TABLET | Freq: Three times a day (TID) | ORAL | 0 refills | Status: AC | PRN

## 2017-11-12 MED ORDER — MELOXICAM 7.5 MG PO TABS: 7.5000 mg | ORAL_TABLET | Freq: Every day | ORAL | 0 refills | Status: AC | PRN

## 2017-11-12 NOTE — Discharge Summary (Signed)
Alyssa Cunningham SNK:[id number] DOB: [birth year] DOA: 11/11/2017  PCP: [name], DO  Admit date: 11/11/2017  Discharge date: 11/12/2017  Admitted From: Home   Disposition:  Home   Recommendations for Outpatient Follow-up:   Follow up with PCP in 1-2 weeks  PCP Please obtain BMP/CBC, 2 view CXR in 1week,  (see Discharge instructions)   PCP Please follow up on the following pending results:    Home Health: None   Equipment/Devices: None  Consultations: GI Discharge Condition: Stable   CODE STATUS: Ful   Diet Recommendation:  Heart Healthy     Chief Complaint  Patient presents with  . Abdominal Pain  . Nausea  . Emesis     Brief history of present illness from the day of admission and additional interim summary     Alyssa Cunningham is a 22 y.o. female with medical history significant of anxiety and s/p cholecystectomy 08/2017; who presents with complaints of right-sided abdominal pain, nausea, vomiting, and diarrhea since being discharged from the hospital on 09/23/2017.  Surgery was performed by Dr. Dema Severin and patient had follow-up visits with them concerning her symptoms.  She has had repeat CT scan of the abdomen and ultrasound, but no clear cause of symptoms has been found.  Given Zofran without relief of symptoms.  Denies having any blood in emesis or stools.  Over the last 3 days patient reports being unable to keep any food or liquids down.  Has not had any redness or erythema around surgical sites.  She was seen by gastroenterology today and due to her symptoms recommended to come in for further evaluation.                                                                    Hospital Course    1.  Persistent nausea vomiting status post cholecystectomy 5 weeks ago.  Seen by GI physician Dr. Arelia Longest  underwent EGD which was unremarkable, CT scan abdomen pelvis unremarkable, abdominal exam benign, GI recommendations noted, patient placed on PPI, Remeron, once a day meloxicam along with as needed Zofran.  She has history of marijuana use and was counseled to quit, she does admit that she was using a lot of marijuana in the past but now she was smoking or using it once a week.  Has been advised that marijuana use can cause cyclical vomiting.  Will be discharged if tolerates diet later today with outpatient GI and PCP follow-up.   Discharge diagnosis     Principal Problem:   Abdominal pain Active Problems:   RUQ abdominal pain   Non-intractable vomiting with nausea   Diarrhea   S/P cholecystectomy   Persistent vomiting    Discharge instructions    Discharge Instructions    Discharge instructions   Complete by:  As directed    Follow with Primary MD [name], DO in 7 days   Get CBC, CMP,  checked  by Primary MD  in 5-7 days    Activity: As tolerated with Full fall precautions use walker/cane & assistance as needed  Disposition Home   Diet:   Heart Healthy    For Heart failure patients - Check your Weight same time everyday, if you gain over 2 pounds, or you develop in leg swelling, experience more shortness of breath or chest pain, call your Primary MD immediately. Follow Cardiac Low Salt Diet and 1.5 lit/day fluid restriction.  Special Instructions: If you have smoked or chewed Tobacco  in the last 2 yrs please stop smoking, stop any regular Alcohol  and or any Recreational drug use.  On your next visit with your primary care physician please Get Medicines reviewed and adjusted.  Please request your Prim.MD to go over all Hospital Tests and Procedure/Radiological results at the follow up, please get all Hospital records sent to your Prim MD by signing hospital release before you go home.  If you experience worsening of your admission symptoms, develop shortness of breath,  life threatening emergency, suicidal or homicidal thoughts you must seek medical attention immediately by calling 911 or calling your MD immediately  if symptoms less severe.  You Must read complete instructions/literature along with all the possible adverse reactions/side effects for all the Medicines you take and that have been prescribed to you. Take any new Medicines after you have completely understood and accpet all the possible adverse reactions/side effects.   Do not drive, operate heavy machinery, perform activities at heights, swimming or participation in water activities or provide baby sitting services if your were admitted for syncope or siezures until you have seen by Primary MD or a Neurologist and advised to do so again.  Do not drive when taking Pain medications.    Do not take more than prescribed Pain, Sleep and Anxiety Medications  Wear Seat belts while driving.   Please note  You were cared for by a hospitalist during your hospital stay. If you have any questions about your discharge medications or the care you received while you were in the hospital after you are discharged, you can call the unit and asked to speak with the hospitalist on call if the hospitalist that took care of you is not available. Once you are discharged, your primary care physician will handle any further medical issues. Please note that NO REFILLS for any discharge medications will be authorized once you are discharged, as it is imperative that you return to your primary care physician (or establish a relationship with a primary care physician if you do not have one) for your aftercare needs so that they can reassess your need for medications and monitor your lab values.   Increase activity slowly   Complete by:  As directed       Discharge Medications   Allergies as of 11/12/2017   No Known Allergies     Medication List    STOP taking these medications   ibuprofen 200 MG tablet Commonly  known as:  ADVIL,MOTRIN   ondansetron 4 MG tablet Commonly known as:  ZOFRAN     TAKE these medications   APAP 325 MG tablet You can take 2 tablets every 4 hours as needed for pain.  You can alternate this with ibuprofen or the oxycodone.   meloxicam 7.5 MG tablet Commonly known as:  MOBIC  Take 1 tablet (7.5 mg total) by mouth daily as needed for up to 15 days for pain.   mirtazapine 15 MG disintegrating tablet Commonly known as:  REMERON SOL-TAB Take 1 tablet (15 mg total) by mouth at bedtime.   Norethindrone Acetate-Ethinyl Estradiol 1.5-30 MG-MCG tablet Commonly known as:  JUNEL 1.5/30 Take 1 tablet by mouth daily.   ondansetron 4 MG disintegrating tablet Commonly known as:  ZOFRAN ODT Take 1 tablet (4 mg total) by mouth every 8 (eight) hours as needed for nausea or vomiting.   pantoprazole 40 MG tablet Commonly known as:  PROTONIX Take 1 tablet (40 mg total) by mouth daily.   sertraline 100 MG tablet Commonly known as:  ZOLOFT Take 1 tablet (100 mg total) by mouth daily.       Follow-up Information    [name], DO. Schedule an appointment as soon as possible for a visit in 1 week(s).   Specialty:  Family Medicine Contact information: [place] [address] [phone]        Gatha Mayer, MD. Schedule an appointment as soon as possible for a visit in 1 week(s).   Specialty:  Gastroenterology Contact information: [address] [place] [address] [phone]           Major procedures and Radiology Reports - PLEASE review detailed and final reports thoroughly  -      EGD - Recommendation:           - Return patient to hospital ward for ongoing care.                            will use Toradol IV while here, I think mirtazapine                            will be useful 15 mg qhs for nausea and insomnia                            and psych issues. Was using marijuana frequently in                             summer 2018 but says now maybe 1x/week so do not                            think that is related                           Hopefully home later today                           F/U PCP and/or psychiatry 1- 2 mos re: psych issues                            and can get GI f/u if the nausea and vomiting and                            pain do not resolve  I think meloxicam 15 mg daily x 1 month or 2 makes                            sense also to treat abdominal wall pain along with                            prn ondansetron                           - Clear liquid diet.                           - Patient has a contact number available for                            emergencies. The signs and symptoms of potential                            delayed complications were discussed with the                            patient. Return to normal activities tomorrow.                            Written discharge instructions were provided to the                            patient.                           - Continue present medications. Procedure Code(s):        --- Professional ---                           [phone], Esophagogastroduodenoscopy, flexible,                            transoral; diagnostic, including collection of                            specimen(s) by brushing or washing, when performed                            (separate procedure)                           99152, Moderate sedation services provided by the                            same physician or other qualified health care                            professional performing the diagnostic or                            therapeutic service that the sedation  supports,                            requiring the presence of an independent trained                            observer to assist in the monitoring of the                            patient's level of consciousness and physiological                             status; initial 15 minutes of intraservice time,                            patient age 63 years or older Diagnosis Code(s):        --- Professional ---                           K22.8, Other specified diseases of esophagus                           R10.10, Upper abdominal pain, unspecified                           R11.0, Nausea                           R11.10, Vomiting, unspecified     US Abdomen Complete  Result Date: 11/03/2017 CLINICAL DATA:  Post cholecystectomy.  Abnormal abdominal CT. EXAM: ABDOMEN ULTRASOUND COMPLETE COMPARISON:  None. FINDINGS: Gallbladder: Post cholecystectomy. There is a curvilinear collection within the gallbladder fossa which measures 5.5 x 0.7 x 0.4 cm. Common bile duct: Diameter: 2.8 mm Liver: No focal lesion identified. Within normal limits in parenchymal echogenicity. Portal vein is patent on color Doppler imaging with normal direction of blood flow towards the liver. IVC: No abnormality visualized. Pancreas: Visualized portion unremarkable. Spleen: Size and appearance within normal limits. Right Kidney: Length: 10.4 cm. Echogenicity within normal limits. No mass or hydronephrosis visualized. Left Kidney: Length: 10.1 cm. Echogenicity within normal limits. No mass or hydronephrosis visualized. Abdominal aorta: No aneurysm visualized. Other findings: None. IMPRESSION: Thin curvilinear collection within the gallbladder fossa. This may represent residual postsurgical collection. However infected postsurgical collection or bile leak cannot be excluded. Electronically Signed   By: Fidela Salisbury M.D.   On: 11/03/2017 22:20   Ct Abdomen Pelvis W Contrast  Result Date: 11/11/2017 CLINICAL DATA:  Abdominal pain, nausea, and vomiting for the past 3 days. Cholecystectomy 2 months ago. EXAM: CT ABDOMEN AND PELVIS WITH CONTRAST TECHNIQUE: Multidetector CT imaging of the abdomen and pelvis was performed using the standard protocol following bolus administration of  intravenous contrast. CONTRAST:  142mL ISOVUE-300 IOPAMIDOL (ISOVUE-300) INJECTION 61% COMPARISON:  Abdominal ultrasound dated November 03, 2017. CT abdomen pelvis dated October 10, 2017. FINDINGS: Lower chest: No acute abnormality. Hepatobiliary: No focal liver abnormality. Focal fat along the falciform ligament. Status post cholecystectomy. There is a trace amount of fluid in the gallbladder fossa, unchanged when compared to prior CT. No biliary dilatation. Pancreas: Unremarkable. No pancreatic ductal dilatation  or surrounding inflammatory changes. Spleen: Normal in size without focal abnormality. Adrenals/Urinary Tract: Adrenal glands are unremarkable. Unchanged punctate nonobstructive right renal calculi. No hydronephrosis. Bladder is unremarkable. Stomach/Bowel: Stomach is within normal limits. Appendix appears normal. No evidence of bowel wall thickening, distention, or inflammatory changes. Vascular/Lymphatic: No significant vascular findings are present. No enlarged abdominal or pelvic lymph nodes. Reproductive: Uterus and bilateral adnexa are unremarkable. Other: No abdominal wall hernia or abnormality. No pneumoperitoneum. Musculoskeletal: No acute or significant osseous findings. IMPRESSION: 1.  No acute intra-abdominal process. 2. Unchanged trace amount of fluid in the gallbladder fossa, favored to be postsurgical in etiology and less likely biliary leak given relative stability since 10/10/2017. 3. Punctate right nephrolithiasis, unchanged. Electronically Signed   By: Titus Dubin M.D.   On: 11/11/2017 19:44    Micro Results     No results found for this or any previous visit (from the past 240 hour(s)).  Today   Subjective    Alyssa Cunningham today has no headache,no chest abdominal pain,no new weakness tingling or numbness, feels much better wants to go home today.    Objective   Blood pressure (!) 102/37, pulse 68, temperature 98.2 F (36.8 C), temperature source Oral, resp. rate 20,  SpO2 98 %.   Intake/Output Summary (Last 24 hours) at 11/12/2017 1239 Last data filed at 11/12/2017 0839 Gross per 24 hour  Intake 400 ml  Output -  Net 400 ml    Exam Awake Alert, Oriented x 3, No new F.N deficits, Normal affect [place].AT,PERRAL Supple Neck,No JVD, No cervical lymphadenopathy appriciated.  Symmetrical Chest wall movement, Good air movement bilaterally, CTAB RRR,No Gallops,Rubs or new Murmurs, No Parasternal Heave +ve B.Sounds, Abd Soft, Non tender, No organomegaly appriciated, No rebound -guarding or rigidity. No Cyanosis, Clubbing or edema, No new Rash or bruise   Data Review   CBC w Diff:  Lab Results  Component Value Date   WBC 7.8 11/12/2017   HGB 13.0 11/12/2017   HCT 38.7 11/12/2017   PLT 227 11/12/2017   LYMPHOPCT 30.7 07/08/2017   MONOPCT 7.6 07/08/2017   EOSPCT 1.3 07/08/2017   BASOPCT 0.4 07/08/2017    CMP:  Lab Results  Component Value Date   NA 139 11/12/2017   K 3.9 11/12/2017   CL 109 11/12/2017   CO2 18 (L) 11/12/2017   BUN 9 11/12/2017   CREATININE 0.74 11/12/2017   PROT 6.5 11/12/2017   ALBUMIN 3.7 11/12/2017   BILITOT 1.1 11/12/2017   ALKPHOS 49 11/12/2017   AST 17 11/12/2017   ALT 12 (L) 11/12/2017  .   Total Time in preparing paper work, data evaluation and todays exam - 56 minutes  Lala Lund M.D on 11/12/2017 at 12:39 PM  [facility]   Office  [phone]

## 2017-11-12 NOTE — Progress Notes (Signed)
Patient Name: Alyssa Cunningham Date of Encounter: 11/12/2017, 9:18 AM    Subjective  In bed - stopped vomiting about 0030. Still w/ sharp abdominal pains and nausea at times  Ondansetron helps  Note from GI office yesterday  Subjective:    Patient ID: Alyssa Cunningham, female    DOB: 26-Nov-1995, 22 y.o.   MRN: [id number]  HPI  Alyssa Cunningham is a pleasant 22 year old white female, who is seen once previously in our office in November 2018 and established by Dr. Fuller Plan. At that time she came in with complaints of nausea vomiting and right-sided abdominal pain. At that time she's been vomiting about once a week.. She had had a previous CT scan which was negative. [facility]CK HIDA scan was done, and she was started on Protonix. CCK HIDA scan came back with a low ejection fraction of 22%. She was referred to surgery, was seen by Dr. Nadeen Landau and admitted on 09/20/2017, after she presented with worsening abdominal pain nausea and vomiting. She underwent laparoscopic cholecystectomy on 12/27. With diagnosis of a calculus cholecystitis. Patient states that she did well for about a week after the cholecystectomy and then started having problems again with nausea vomiting and abdominal pain. 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 She had repeat CT imaging on 10/10/2017 per surgery showing her to be postcholecystectomy, there is a small 3.3 x 1.3 x 0.6 cm slightly complex fluid collection in the gallbladder fossa and a 1 x 1 x 1 cm slightly complex fluid collection in the left lobe of the liver.. Patient has continued to feel ill. He says she has lost at least 20 pounds since surgery, and has several days a week where she is nauseated 24 hours a day, vomiting off and on and unable to keep down any by mouth's. She complains of exhaustion. She has had chills but no documented fever. She was seen again by surgery on 11/03/2017 had abdominal ultrasound ordered which showed this fluid collection to be  slightly larger at 5.5 x 0.7 x 0.4 cm, rule out infected postsurgical collection or bile leak. I don't have any recent labs. She says she's lost another 3 pounds over the past week, has not eaten anything over the past 2 days. She feels weak and continues to have right-sided constant abdominal pain. She was hemodynamically stable in our office today but actively vomiting, appears dry.   Review of Systems Pertinent positive and negative review of systems were noted in the above HPI section.  All other review of systems was otherwise negative.      Outpatient Encounter Medications as of 11/11/2017  Medication Sig  . acetaminophen 325 MG tablet You can take 2 tablets every 4 hours as needed for pain.  You can alternate this with ibuprofen or the oxycodone.  Marland Kitchen ibuprofen (ADVIL,MOTRIN) 200 MG tablet You can take 2-3 tablets every 6 hours as needed for pain.  You can alternate this with plain Tylenol or oxycodone.  . Norethindrone Acetate-Ethinyl Estradiol (JUNEL 1.5/30) 1.5-30 MG-MCG tablet Take 1 tablet by mouth daily.  . ondansetron (ZOFRAN ODT) 4 MG disintegrating tablet Take 1 tablet (4 mg total) by mouth every 8 (eight) hours as needed for nausea or vomiting.  . pantoprazole (PROTONIX) 40 MG tablet Take 1 tablet (40 mg total) by mouth daily.  . sertraline (ZOLOFT) 100 MG tablet Take 1 tablet (100 mg total) by mouth daily.  . [DISCONTINUED] oxyCODONE (OXY IR/ROXICODONE) 5 MG immediate release tablet Take 1-2 tablets (5-10  mg total) by mouth every 4 (four) hours as needed for moderate pain.   No facility-administered encounter medications on file as of 11/11/2017.    No Known Allergies     Patient Active Problem List   Diagnosis Date Noted  . Cholecystitis 09/20/2017  . Right upper quadrant pain 08/04/2017  . Non-intractable vomiting with nausea 08/04/2017  . Gastroesophageal reflux disease without esophagitis 08/04/2017  . Dysfunctional uterine bleeding 03/11/2017  . Dysmenorrhea  02/20/2015  . Frequent headaches 02/20/2015  . Osteochondroma 01/05/2015  . Menometrorrhagia 10/08/2013   Social History        Socioeconomic History  . Marital status: Single    Spouse name: Not on file  . Number of children: 0  . Years of education: Not on file  . Highest education level: Not on file  Social Needs  . Financial resource strain: Not on file  . Food insecurity - worry: Not on file  . Food insecurity - inability: Not on file  . Transportation needs - medical: Not on file  . Transportation needs - non-medical: Not on file  Occupational History  . Occupation: Ship broker  Tobacco Use  . Smoking status: Never Smoker  . Smokeless tobacco: Never Used  Substance and Sexual Activity  . Alcohol use: No  . Drug use: No  . Sexual activity: No  Other Topics Concern  . Not on file  Social History Narrative   Work or School: Going to school in June      Home Situation:       Spiritual Beliefs: Christian      Lifestyle: no regular exercise; diet is healthy       Alyssa Cunningham She was adopted. Family history is unknown by patient.      Objective:     Vitals:   11/11/17 1400  BP: 120/70  Pulse: 84  Temp: 98.4 F (36.9 C)    Physical Exam; well-developed ill appearing young white female in no acute distress, had just vomited into trash can, accompanied by her brother blood pressure 120/70 pulse in the 80s height 5 foot 9, weight 153 BMI 22.5 temp 98 4. HEENT; nontraumatic normocephalic EOMI PERRLA sclera anicteric, buccal mucosa somewhat dry, Cardiovascular ;regular rate and rhythm with S1-S2 no murmur or gallop, Pulmonary; clear bilaterally, Abdomen; soft, bowel sounds are present she is quite tender in the right upper quadrant there is no guarding or rebound no mass or hepatosplenomegaly bowel sounds are present no succussion splash, Rectal ;exam not done, Extremities ;no clubbing cyanosis or edema skin warm and dry, Neuropsych; mood and affect  appropriate       Assessment & Plan:   #80 22 year old white female with persistent right-sided abdominal pain nausea vomiting fatigue and significant weight loss of 20 pounds status post cholecystectomy on 09/22/2017. Patient has a small fluid collection documented on CT scan on 10/10/2017 and again on ultrasound 11/03/2017 - the ultrasound showed the collection to be a bit larger at 5 cm x 1 cm. It is unclear whether this fluid collection represents an abscess, or whether she had a bile leak that sealed, however with her persistent symptoms need to consider infected collection. This patient also had similar complaints prior to her cholecystectomy which prompted the workup. She may have other underlying GI issues, gastroparesis, peptic ulcer disease. #2 anxiety #3 GERD  Plan; discussed with Dr. Loletha Carrow who also evaluated patient in the office today. Patient needs hospitalization for symptom control, IV fluid hydration, repeat labs, blood cultures and  would repeat imaging with upper abdominal ultrasound to reassess the gallbladder fossa fluid collection. She may need IR consultation for drainage. I contacted the [facility] for direct admission to the hospitalist service, however they felt she would be better served with ER care prior to admission. GI will see her in consultation. If the fluid collection is not felt to be the source of her current symptoms and/or symptoms don't improve with drainage she will need further GI evaluation with upper endoscopy. Would also start her on IV PPI twice daily.  Amy S Esterwood PA-C 11/11/2017      Objective  BP (!) 117/58 (BP Location: Right Arm)   Pulse 67   Temp 98.2 F (36.8 C) (Oral)   Resp 18   SpO2 99%  Eyes anicteric Lungs cta Cor s1s2 no rmg abd soft, BS+ tender diffusely upper abdomen and + Carnetts sign  Labs reviewed NL except hematuria    Assessment and Plan  Persistent nausea and vomiting Upper abdominal pain  (at least some musculoskeletal) Anxiety Insomnia S/P lap chole - Gallbladder dyskinesia - not better - do not think fluid collection is a problem  EGD today The risks and benefits as well as alternatives of endoscopic procedure(s) have been discussed and reviewed. All questions answered. The patient agrees to proceed.  Further plans after that  thanks  Gatha Mayer, MD, [facility] [phone] (pager) 11/12/2017 9:18 AM

## 2017-11-12 NOTE — Progress Notes (Signed)
Discharge instructions and medications discussed with patient.  Prescriptions and AVS given to patient. All questions answered.  

## 2017-11-12 NOTE — Progress Notes (Signed)
Pt arrved to unit room 1508 via stretcher with parents. VS taken, pt oriented to room and callbell with no complications. Gait steady, general weakness pain 8/10. Initial assessment completed. Will continue to monitor

## 2017-11-12 NOTE — Op Note (Signed)
[facility] Patient Name: Alyssa Cunningham Procedure Date: 11/12/2017 MRN: [id number] Attending MD: Gatha Mayer , MD Date of Birth: 02-08-1996 CSN: [id number] Age: 22 Admit Type: Inpatient Procedure:                Upper GI endoscopy Indications:              Upper abdominal pain, Nausea, Persistent vomiting Providers:                Gatha Mayer, MD, Cleda Daub, RN, William Dalton, Technician Referring MD:              Medicines:                Midazolam 9 mg IV, Fentanyl 75 micrograms IV,                            Diphenhydramine 25 mg IV Complications:            No immediate complications. Estimated Blood Loss:     Estimated blood loss: none. Procedure:                Pre-Anesthesia Assessment:                           - Prior to the procedure, a History and Physical                            was performed, and patient medications and                            allergies were reviewed. The patient's tolerance of                            previous anesthesia was also reviewed. The risks                            and benefits of the procedure and the sedation                            options and risks were discussed with the patient.                            All questions were answered, and informed consent                            was obtained. Prior Anticoagulants: The patient has                            taken no previous anticoagulant or antiplatelet                            agents. ASA Grade Assessment: II - A patient with  mild systemic disease. After reviewing the risks                            and benefits, the patient was deemed in                            satisfactory condition to undergo the procedure.                           After obtaining informed consent, the endoscope was                            passed under direct vision. Throughout the   procedure, the patient's blood pressure, pulse, and                            oxygen saturations were monitored continuously. The                            Endoscope was introduced through the mouth, and                            advanced to the second part of duodenum. The upper                            GI endoscopy was accomplished without difficulty.                            The patient tolerated the procedure well. Scope In: Scope Out: Findings:      Diffuse mild erythema was found in the lower third of the esophagus.      The exam was otherwise without abnormality.      The cardia and gastric fundus were normal on retroflexion. Impression:               - Erythema in the lower third of the esophagus.                           - The examination was otherwise normal.                           - No specimens collected. Moderate Sedation:      Moderate (conscious) sedation was administered by the endoscopy nurse       and supervised by the endoscopist. The following parameters were       monitored: oxygen saturation, heart rate, blood pressure, respiratory       rate, EKG, adequacy of pulmonary ventilation, and response to care.       Total physician intraservice time was 15 minutes. Recommendation:           - Return patient to hospital ward for ongoing care.                            will use Toradol IV while here, I think mirtazapine  will be useful 15 mg qhs for nausea and insomnia                            and psych issues. Was using marijuana frequently in                            summer 2018 but says now maybe 1x/week so do not                            think that is related                           Hopefully home later today                           F/U PCP and/or psychiatry 1- 2 mos re: psych issues                            and can get GI f/u if the nausea and vomiting and                            pain do not resolve                            I think meloxicam 15 mg daily x 1 month or 2 makes                            sense also to treat abdominal wall pain along with                            prn ondansetron                           - Clear liquid diet.                           - Patient has a contact number available for                            emergencies. The signs and symptoms of potential                            delayed complications were discussed with the                            patient. Return to normal activities tomorrow.                            Written discharge instructions were provided to the                            patient.                           -  Continue present medications. Procedure Code(s):        --- Professional ---                           [phone], Esophagogastroduodenoscopy, flexible,                            transoral; diagnostic, including collection of                            specimen(s) by brushing or washing, when performed                            (separate procedure)                           99152, Moderate sedation services provided by the                            same physician or other qualified health care                            professional performing the diagnostic or                            therapeutic service that the sedation supports,                            requiring the presence of an independent trained                            observer to assist in the monitoring of the                            patient's level of consciousness and physiological                            status; initial 15 minutes of intraservice time,                            patient age 15 years or older Diagnosis Code(s):        --- Professional ---                           K22.8, Other specified diseases of esophagus                           R10.10, Upper abdominal pain, unspecified                           R11.0, Nausea                            R11.10, Vomiting, unspecified CPT copyright 2016 [employer]. All rights reserved. The codes documented in this report are preliminary and upon coder review may  be revised to meet  current compliance requirements. Gatha Mayer, MD 11/12/2017 10:35:03 AM This report has been signed electronically. Number of Addenda: 0

## 2017-11-12 NOTE — Discharge Instructions (Signed)
Follow with Primary MD [name], DO in 7 days   Get CBC, CMP,  checked  by Primary MD  in 5-7 days    Activity: As tolerated with Full fall precautions use walker/cane & assistance as needed  Disposition Home   Diet:   Heart Healthy    For Heart failure patients - Check your Weight same time everyday, if you gain over 2 pounds, or you develop in leg swelling, experience more shortness of breath or chest pain, call your Primary MD immediately. Follow Cardiac Low Salt Diet and 1.5 lit/day fluid restriction.  Special Instructions: If you have smoked or chewed Tobacco  in the last 2 yrs please stop smoking, stop any regular Alcohol  and or any Recreational drug use.  On your next visit with your primary care physician please Get Medicines reviewed and adjusted.  Please request your Prim.MD to go over all Hospital Tests and Procedure/Radiological results at the follow up, please get all Hospital records sent to your Prim MD by signing hospital release before you go home.  If you experience worsening of your admission symptoms, develop shortness of breath, life threatening emergency, suicidal or homicidal thoughts you must seek medical attention immediately by calling 911 or calling your MD immediately  if symptoms less severe.  You Must read complete instructions/literature along with all the possible adverse reactions/side effects for all the Medicines you take and that have been prescribed to you. Take any new Medicines after you have completely understood and accpet all the possible adverse reactions/side effects.   Do not drive, operate heavy machinery, perform activities at heights, swimming or participation in water activities or provide baby sitting services if your were admitted for syncope or siezures until you have seen by Primary MD or a Neurologist and advised to do so again.  Do not drive when taking Pain medications.    Do not take more than prescribed Pain, Sleep and Anxiety  Medications  Wear Seat belts while driving.   Please note  You were cared for by a hospitalist during your hospital stay. If you have any questions about your discharge medications or the care you received while you were in the hospital after you are discharged, you can call the unit and asked to speak with the hospitalist on call if the hospitalist that took care of you is not available. Once you are discharged, your primary care physician will handle any further medical issues. Please note that NO REFILLS for any discharge medications will be authorized once you are discharged, as it is imperative that you return to your primary care physician (or establish a relationship with a primary care physician if you do not have one) for your aftercare needs so that they can reassess your need for medications and monitor your lab values.

## 2017-11-14 ENCOUNTER — Telehealth: Payer: Self-pay | Admitting: Family Medicine

## 2017-11-14 ENCOUNTER — Encounter (HOSPITAL_COMMUNITY): Payer: Self-pay | Admitting: Internal Medicine

## 2017-11-14 NOTE — Telephone Encounter (Signed)
I left a voice message for pt to return my call.  

## 2017-11-15 NOTE — Telephone Encounter (Signed)
I left another voice message for pt to return my call.  

## 2017-11-16 LAB — CULTURE, BLOOD (ROUTINE X 2)
CULTURE: NO GROWTH
CULTURE: NO GROWTH
SPECIAL REQUESTS: ADEQUATE

## 2017-11-16 NOTE — Telephone Encounter (Signed)
Pt is away at school, will call back later to schedule a follow up with Dr. Maudie Mercury.

## 2018-01-31 ENCOUNTER — Other Ambulatory Visit: Payer: Self-pay | Admitting: Family Medicine

## 2018-01-31 DIAGNOSIS — N946 Dysmenorrhea, unspecified: Secondary | ICD-10-CM

## 2018-01-31 DIAGNOSIS — N938 Other specified abnormal uterine and vaginal bleeding: Secondary | ICD-10-CM

## 2018-01-31 NOTE — Telephone Encounter (Signed)
Dr. Maudie Mercury patient, saw Dr. Martinique for an acute visit 8 months ago,need to follow-up with PCP.

## 2018-02-02 NOTE — Telephone Encounter (Signed)
Please call patient. Needs follow up as this was rxd by another provider. Ok to refill x 30 days once appt scheduled.

## 2018-02-03 NOTE — Telephone Encounter (Signed)
I called the pt and left a detailed message stating the RX was sent to her pharmacy for a 30 day supply and she needs to call for an appt.

## 2018-03-05 ENCOUNTER — Other Ambulatory Visit: Payer: Self-pay | Admitting: Family Medicine

## 2018-03-05 DIAGNOSIS — N946 Dysmenorrhea, unspecified: Secondary | ICD-10-CM

## 2018-03-05 DIAGNOSIS — N938 Other specified abnormal uterine and vaginal bleeding: Secondary | ICD-10-CM

## 2018-03-21 ENCOUNTER — Telehealth: Payer: Self-pay | Admitting: *Deleted

## 2018-03-21 DIAGNOSIS — N946 Dysmenorrhea, unspecified: Secondary | ICD-10-CM

## 2018-03-21 DIAGNOSIS — N938 Other specified abnormal uterine and vaginal bleeding: Secondary | ICD-10-CM

## 2018-03-21 MED ORDER — NORETHINDRONE ACET-ETHINYL EST 1.5-30 MG-MCG PO TABS
1.0000 | ORAL_TABLET | Freq: Every day | ORAL | 0 refills | Status: AC
Start: 2018-03-21 — End: ?

## 2018-03-21 NOTE — Telephone Encounter (Signed)
Rx done. 

## 2018-03-29 DIAGNOSIS — Z79899 Other long term (current) drug therapy: Secondary | ICD-10-CM | POA: Diagnosis not present

## 2018-04-18 ENCOUNTER — Other Ambulatory Visit: Payer: Self-pay | Admitting: Gastroenterology

## 2018-04-22 ENCOUNTER — Other Ambulatory Visit: Payer: Self-pay | Admitting: Family Medicine

## 2018-04-22 DIAGNOSIS — N938 Other specified abnormal uterine and vaginal bleeding: Secondary | ICD-10-CM

## 2018-04-22 DIAGNOSIS — N946 Dysmenorrhea, unspecified: Secondary | ICD-10-CM

## 2018-05-09 DIAGNOSIS — Z30430 Encounter for insertion of intrauterine contraceptive device: Secondary | ICD-10-CM | POA: Diagnosis not present

## 2018-06-06 DIAGNOSIS — R112 Nausea with vomiting, unspecified: Secondary | ICD-10-CM | POA: Diagnosis not present

## 2018-06-07 ENCOUNTER — Other Ambulatory Visit: Payer: Self-pay | Admitting: Obstetrics and Gynecology

## 2018-06-09 DIAGNOSIS — Z30431 Encounter for routine checking of intrauterine contraceptive device: Secondary | ICD-10-CM | POA: Diagnosis not present

## 2018-08-09 ENCOUNTER — Other Ambulatory Visit: Payer: Self-pay | Admitting: Gastroenterology

## 2018-10-27 IMAGING — US US ABDOMEN LIMITED
1 series · 14 of 25 positions shown · non-contrast
Comparison: None.

CLINICAL DATA: Right upper quadrant pain for the past 7 months

EXAM:
ULTRASOUND ABDOMEN LIMITED RIGHT UPPER QUADRANT

[Series 1: us abdomen limited · 0.20mm/px · 14 of 46 slices shown]
[im 1/46]
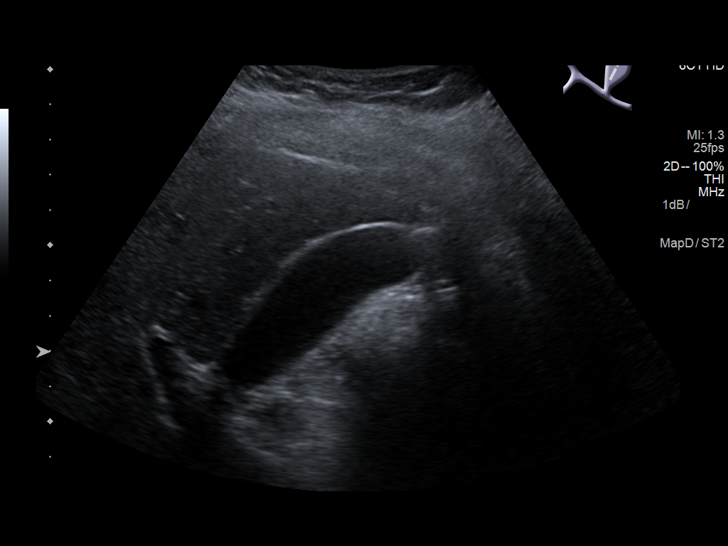
[im 4/46]
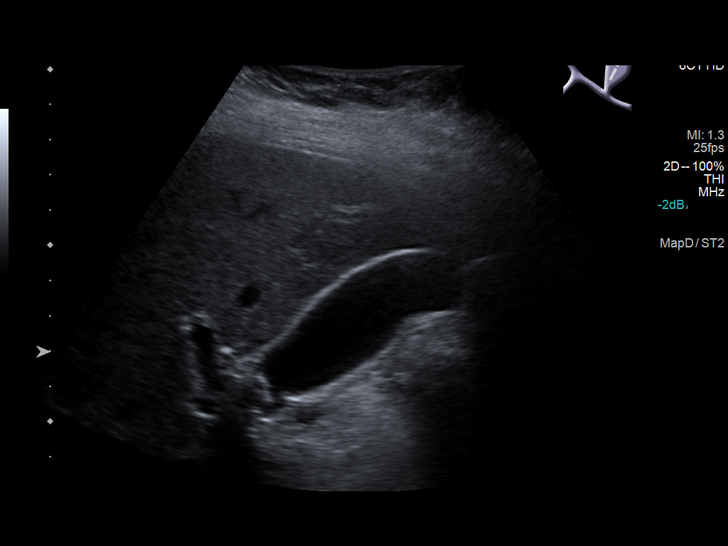
[im 8/46]
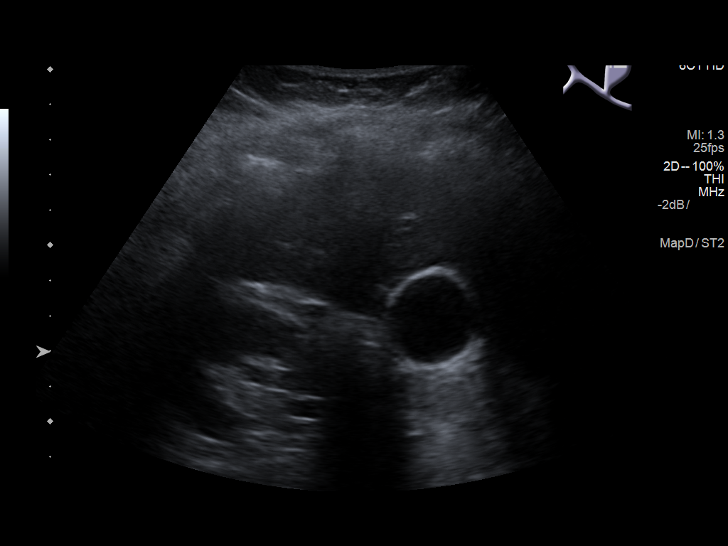
[im 12/46]
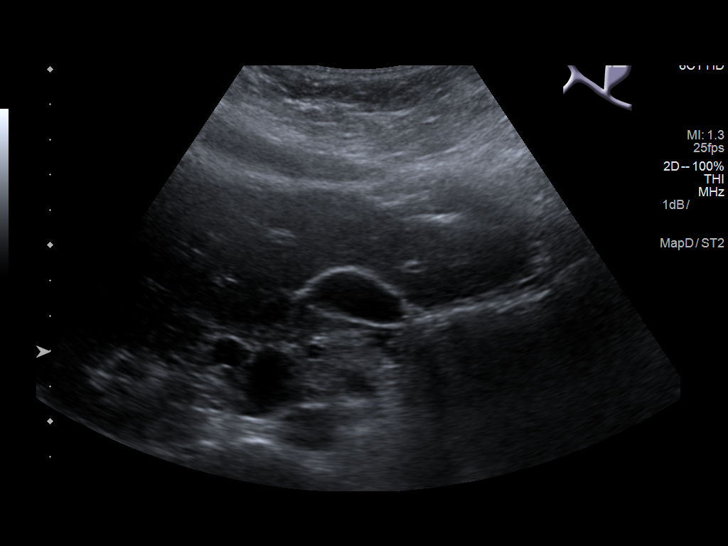
[im 16/46]
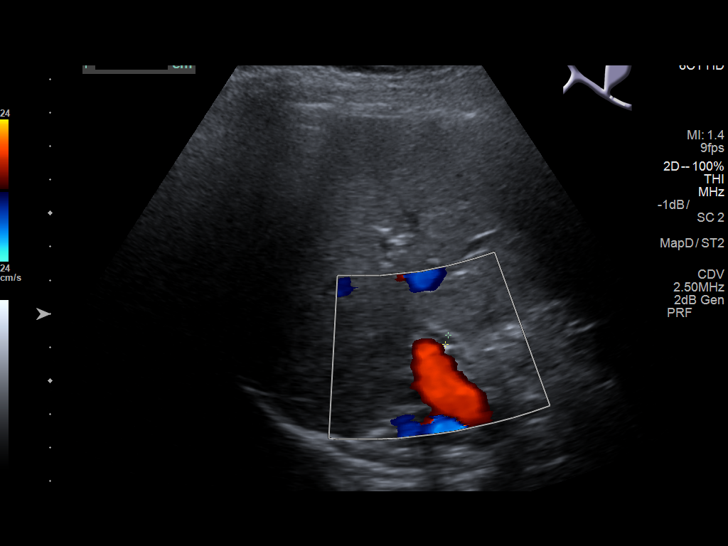
[im 17/46]
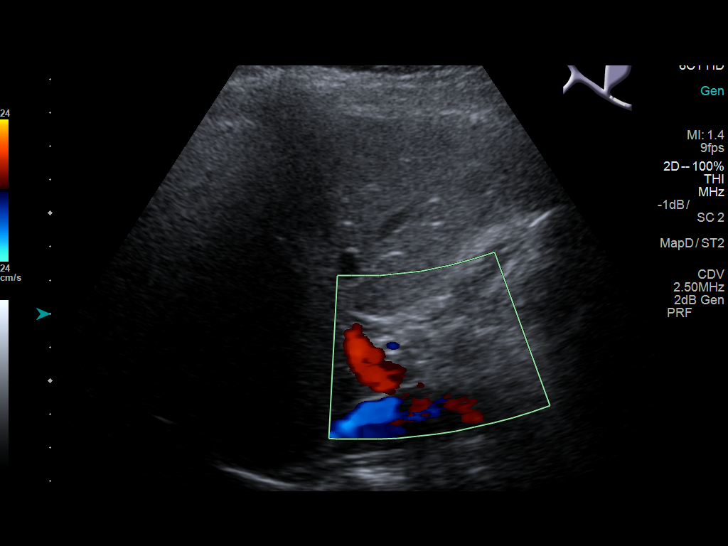
[im 21/46]
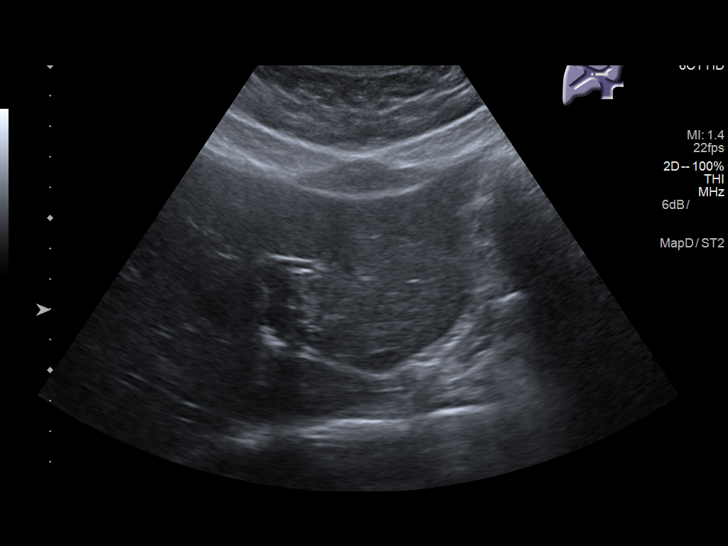
[im 25/46]
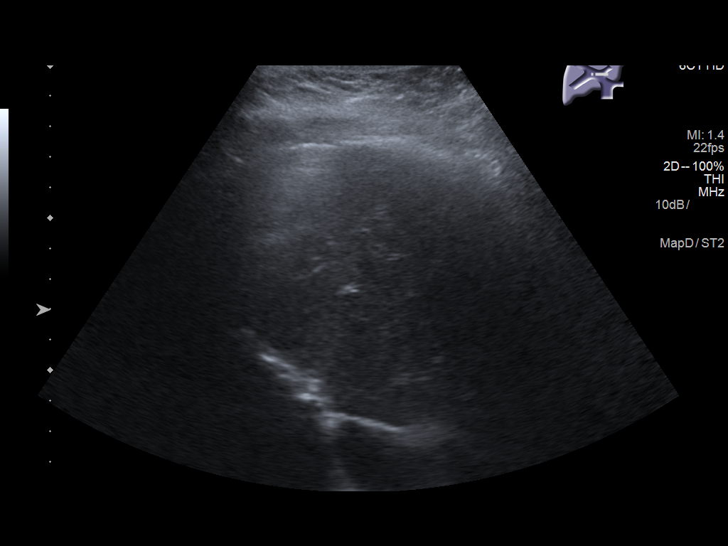
[im 29/46]
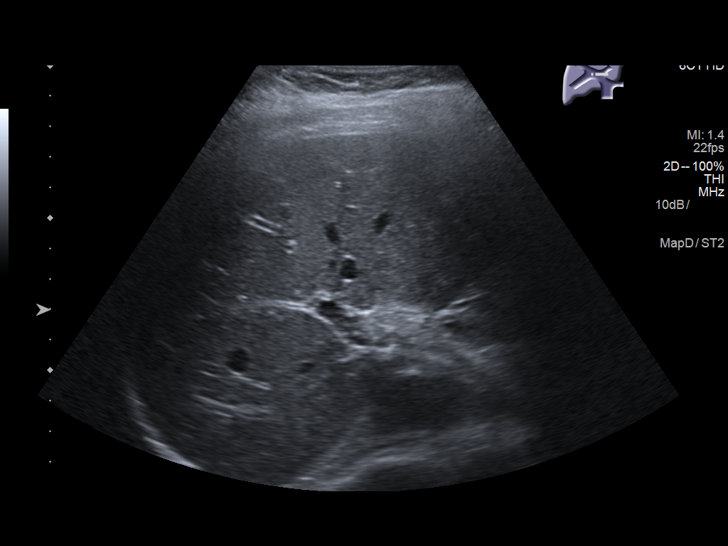
[im 31/46]
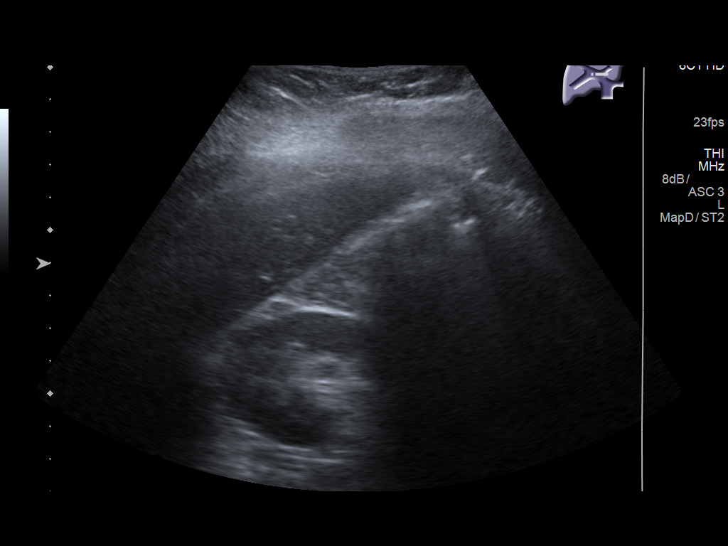
[im 34/46]
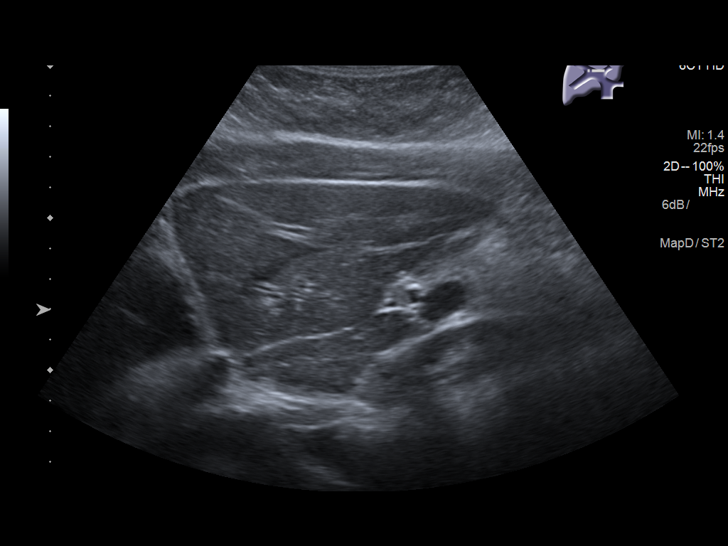
[im 38/46]
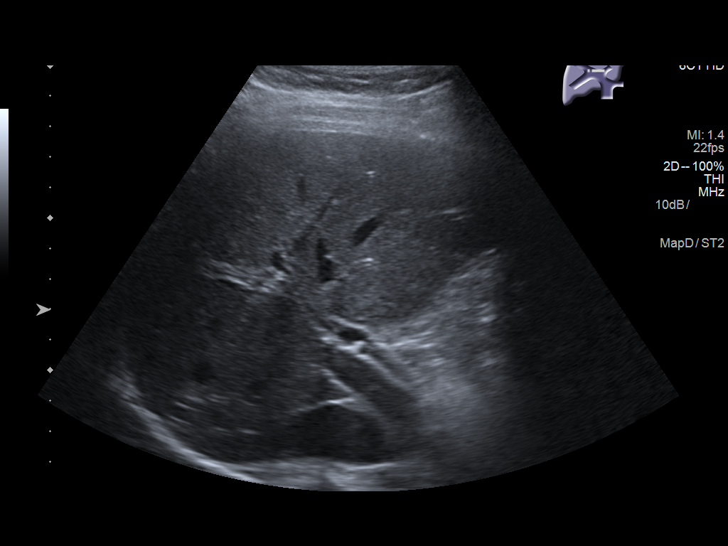
[im 42/46]
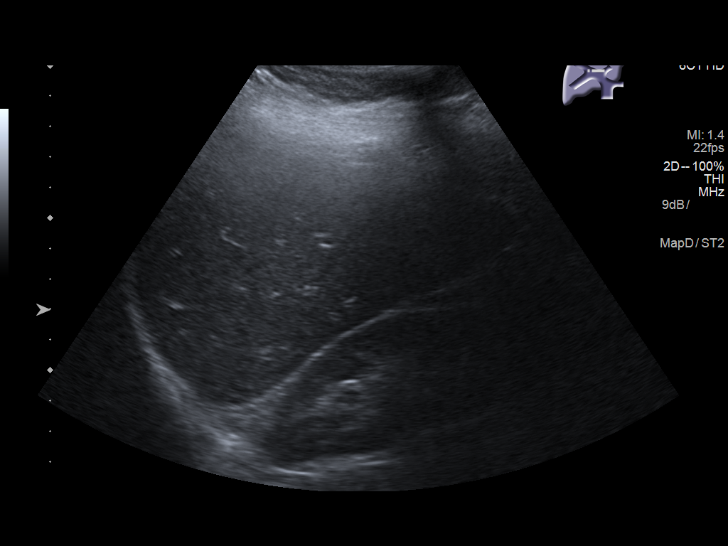
[im 46/46]
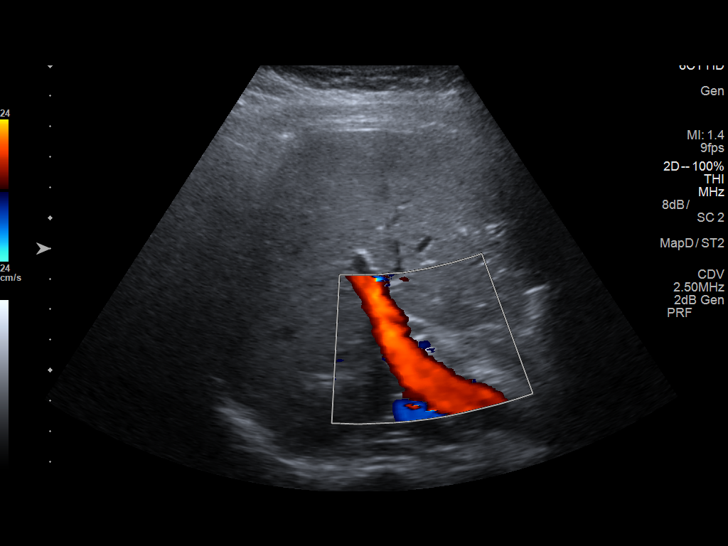

[14 of 25 positions shown; findings below may reference images not displayed]

FINDINGS: Gallbladder:

No gallstones or wall thickening visualized. No sonographic Murphy
sign noted by sonographer.

Common bile duct:

Diameter: 3 mm

Liver:

No focal lesion identified. Within normal limits in parenchymal
echogenicity. Portal vein is patent on color Doppler imaging with
normal direction of blood flow towards the liver.
IMPRESSION: Normal right upper quadrant ultrasound.

## 2019-01-31 DIAGNOSIS — M25572 Pain in left ankle and joints of left foot: Secondary | ICD-10-CM | POA: Diagnosis not present

## 2019-07-02 IMAGING — CT CT ABD-PELV W/ CM
3 of 4 series · 12 of 36 positions shown, 18 images · IV contrast (WATER & [ID] ISOVUE 300)
Comparison: 09/20/2017 ultrasound. 08/15/2017 hepatobiliary study.
No comparison CT.

CLINICAL DATA: 21-year-old female post cholecystectomy 09/22/2017.
Right upper quadrant nausea and vomiting since surgery. Initial
encounter.

EXAM:
CT ABDOMEN AND PELVIS WITH CONTRAST
TECHNIQUE: Multidetector CT imaging of the abdomen and pelvis was performed
using the standard protocol following bolus administration of
intravenous contrast.
CONTRAST:  100mL HDX0ER-O11 IOPAMIDOL (HDX0ER-O11) INJECTION 61%

[Series 3: abd/pelvis with · axial · 0.70mm/px · z∈[-338,-22]mm · 8 of 83 slices shown, 13 images]
[im 10/83  soft-tissue]
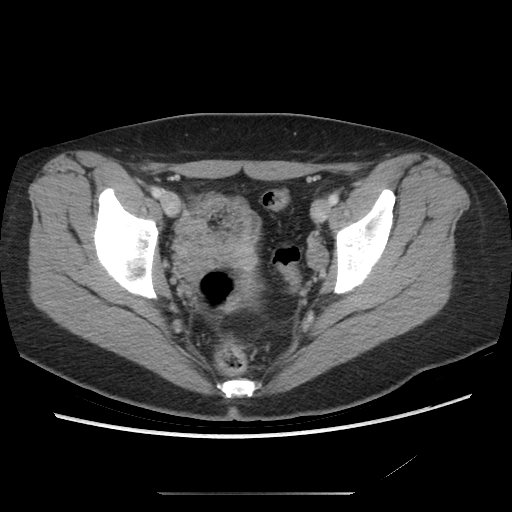
[im 10/83  bone]
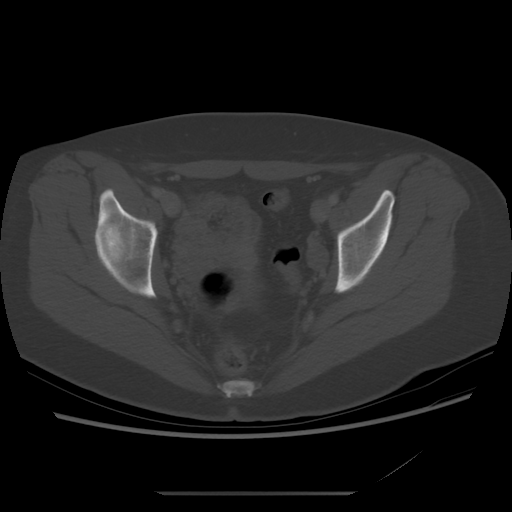
[im 19/83  soft-tissue]
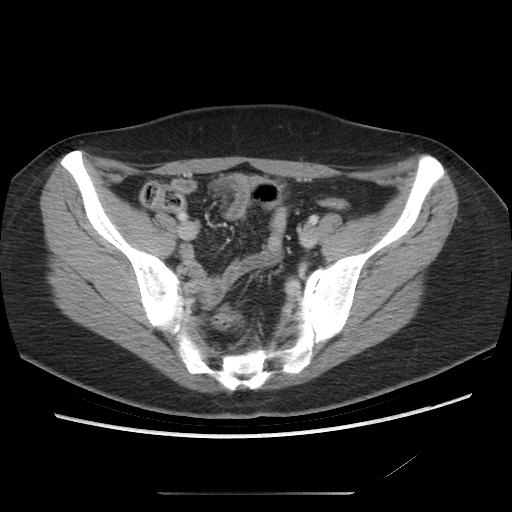
[im 28/83  soft-tissue]
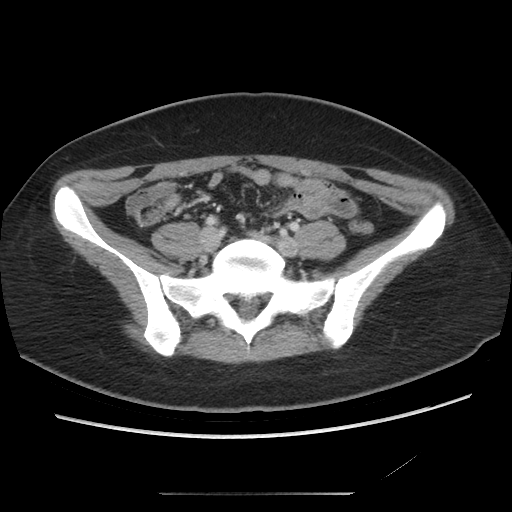
[im 37/83  soft-tissue]
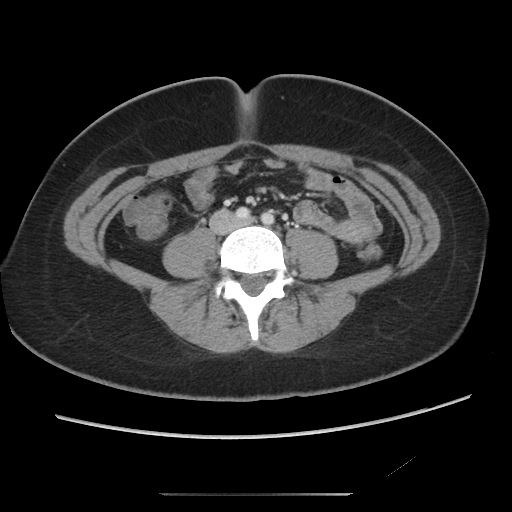
[im 46/83  soft-tissue]
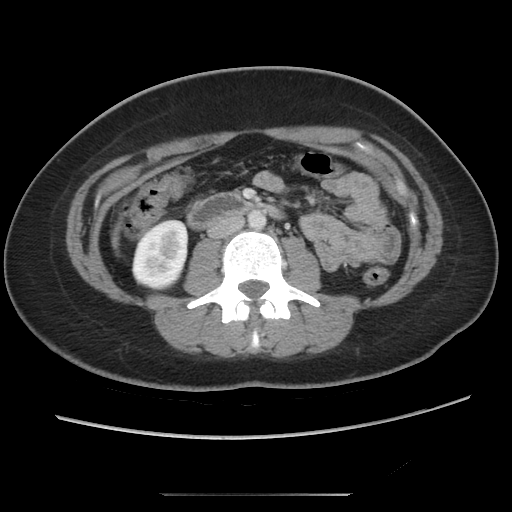
[im 46/83  lung]
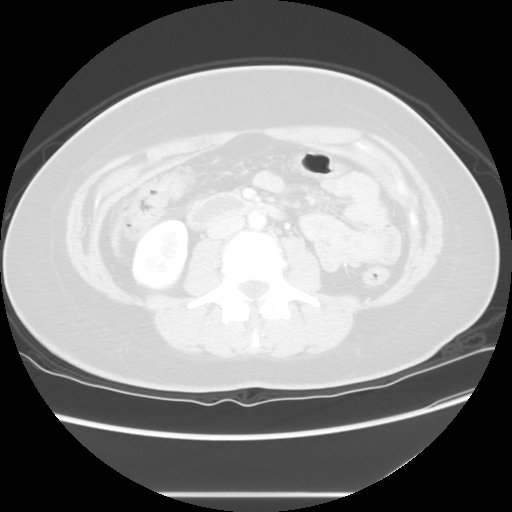
[im 55/83  soft-tissue]
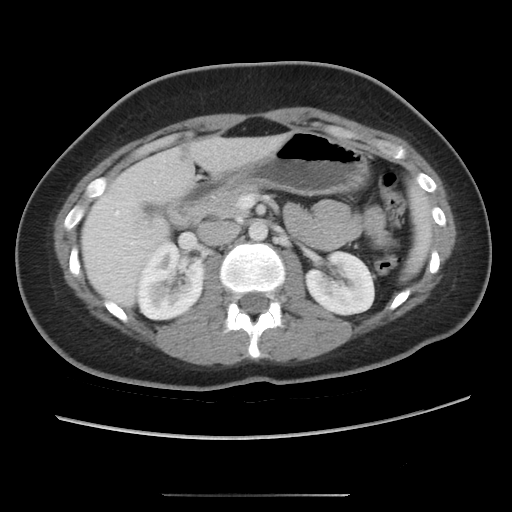
[im 55/83  lung]
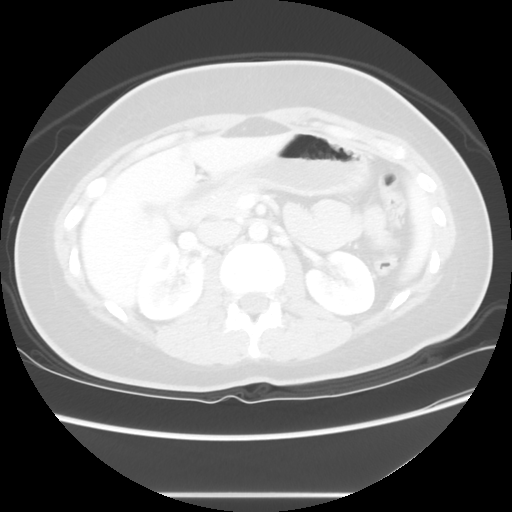
[im 64/83  soft-tissue]
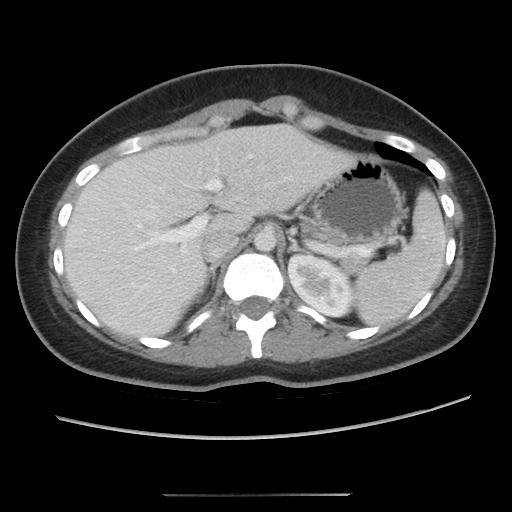
[im 64/83  lung]
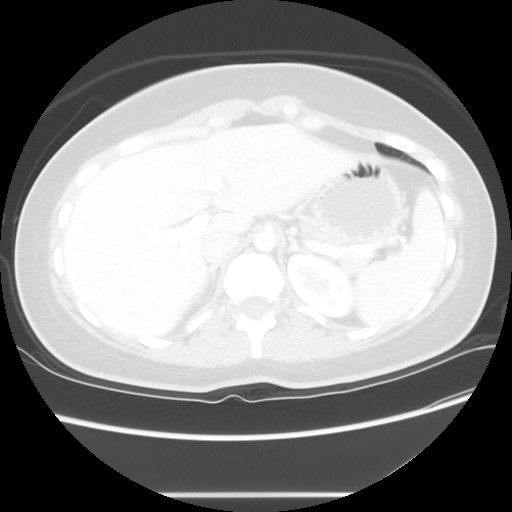
[im 73/83  soft-tissue]
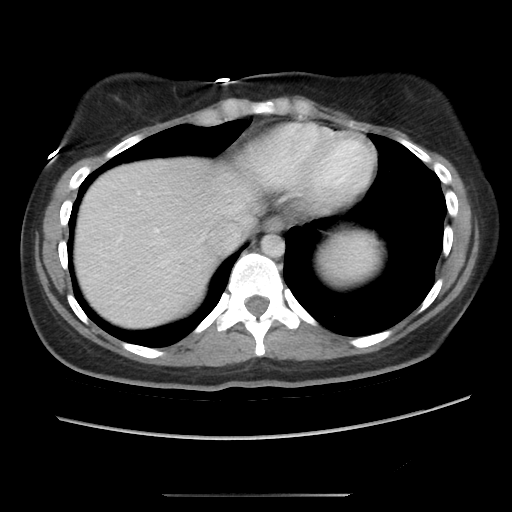
[im 73/83  lung]
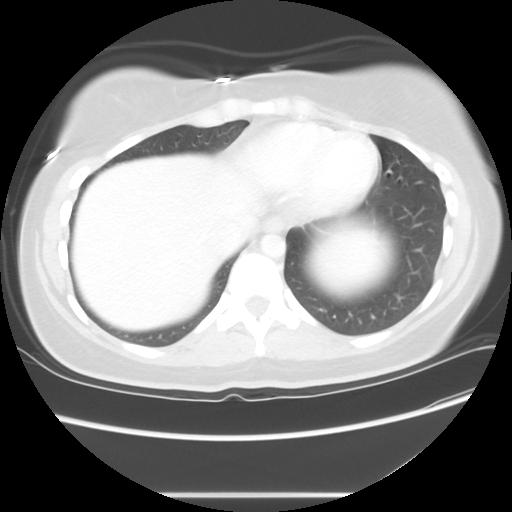

[Series 601: coronal body · coronal · 0.90mm/px · 1 of 108 slices shown, 2 images]
[im 36/108  soft-tissue]
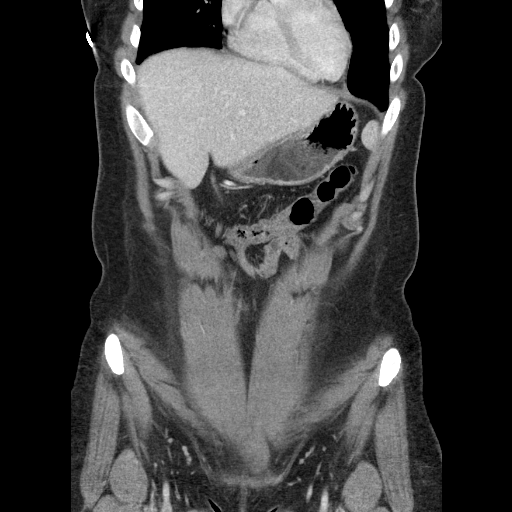
[im 36/108  bone]
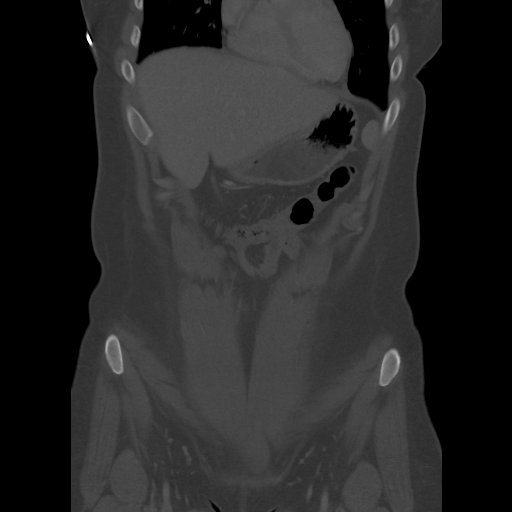

[Series 602: sagittal body · sagittal · 0.90mm/px · 3 of 144 slices shown]
[im 17/144  soft-tissue]
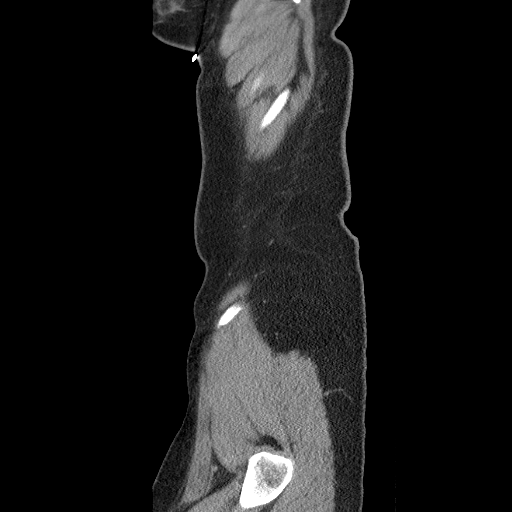
[im 34/144  soft-tissue]
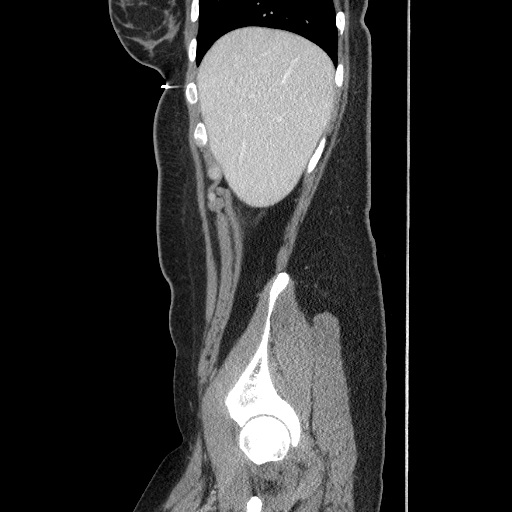
[im 51/144  soft-tissue]
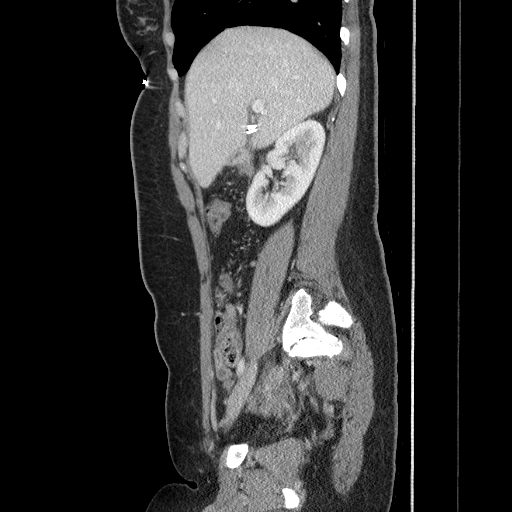

[12 of 36 positions shown; findings below may reference images not displayed]

FINDINGS: Lower chest: Lung bases clear.  Heart size within normal limits.

Hepatobiliary: Post cholecystectomy. In the gallbladder fossa, there
is a small (3.3 x 1.3 x 0.6 cm) slightly complex fluid collection.
Within left lobe of liver adjacent to the falciform ligament, there
is a 1 x 1 x 1.1 cm slightly complex fluid collection.

No intrahepatic biliary duct dilation. No common bile duct calcified
stone or dilation.

Pancreas: No pancreatic mass or inflammation

Spleen: No splenic mass or enlargement.

Adrenals/Urinary Tract: No obstructing stone or hydronephrosis. Tiny
nonobstructing right renal calculi. No renal or adrenal mass.
Decompressed urinary bladder.

Stomach/Bowel: No primary extraluminal bowel inflammatory process.
Specifically, no inflammation surrounds the superiorly extending
appendix.

Vascular/Lymphatic: No aortic aneurysm or large vessel occlusion. No
adenopathy.

Reproductive: Within normal limits for age. No worrisome adnexal
mass or uterine abnormality.

Other: No free intraperitoneal air or bowel containing hernia. Trace
free fluid in the pelvis.

Musculoskeletal: No worrisome osseous abnormality.
IMPRESSION: Post cholecystectomy. In the gallbladder fossa, there is a small
(3.3 x 1.3 x 0.6 cm) slightly complex fluid collection. It is
possible this represents residua of postoperative fluid however,
could not exclude postoperative leak or infection in the proper
clinical setting.

Within left lobe of liver adjacent to the falciform ligament, there
is a 1 x 1 x 1.1 cm slightly complex fluid collection. This may
represent a slightly septated cyst not visualized on preoperative
ultrasound. Postoperative collection such as small abscess is a less
likely consideration.

No intrahepatic biliary duct dilation. No common bile duct calcified
stone or dilation.

Tiny nonobstructing right renal calculi.

No primary bowel inflammatory process identified. Specifically, no
inflammation surrounds superiorly extending appendix.

These results will be called to the ordering clinician or
representative by the Radiologist Assistant, and communication
documented in the PACS or zVision Dashboard.
